# Patient Record
Sex: Male | Born: 1970 | Race: White | Hispanic: No | Marital: Married | Smoking: Never smoker
Health system: Southern US, Community
[De-identification: ages and names within clinical notes are randomized; demographics above are authoritative.]

## PROBLEM LIST (undated history)

## (undated) DIAGNOSIS — I10 Essential (primary) hypertension: Secondary | ICD-10-CM

## (undated) DIAGNOSIS — E7800 Pure hypercholesterolemia, unspecified: Secondary | ICD-10-CM

## (undated) DIAGNOSIS — I2510 Atherosclerotic heart disease of native coronary artery without angina pectoris: Secondary | ICD-10-CM

## (undated) HISTORY — PX: CHOLECYSTECTOMY: SHX55

---

## 2005-05-17 ENCOUNTER — Emergency Department: Payer: Self-pay | Admitting: Emergency Medicine

## 2010-05-06 ENCOUNTER — Ambulatory Visit: Payer: Self-pay | Admitting: Oncology

## 2010-05-20 ENCOUNTER — Ambulatory Visit: Payer: Self-pay | Admitting: Oncology

## 2010-06-06 ENCOUNTER — Ambulatory Visit: Payer: Self-pay | Admitting: Oncology

## 2018-08-10 ENCOUNTER — Ambulatory Visit
Admission: RE | Admit: 2018-08-10 | Discharge: 2018-08-10 | Disposition: A | Discharge status: Home / Self Care | Payer: BC Managed Care – PPO | Source: Ambulatory Visit | Attending: Physician Assistant | Admitting: Physician Assistant

## 2018-08-10 ENCOUNTER — Other Ambulatory Visit: Payer: Self-pay | Admitting: Physician Assistant

## 2018-08-10 DIAGNOSIS — R059 Cough, unspecified: Secondary | ICD-10-CM

## 2018-08-10 DIAGNOSIS — R05 Cough: Secondary | ICD-10-CM | POA: Diagnosis not present

## 2019-11-04 ENCOUNTER — Other Ambulatory Visit: Payer: Self-pay | Admitting: *Deleted

## 2019-11-04 DIAGNOSIS — R1011 Right upper quadrant pain: Secondary | ICD-10-CM

## 2019-11-10 ENCOUNTER — Ambulatory Visit
Admission: RE | Admit: 2019-11-10 | Discharge: 2019-11-10 | Disposition: A | Discharge status: Home / Self Care | Payer: BC Managed Care – PPO | Source: Ambulatory Visit | Attending: *Deleted | Admitting: *Deleted

## 2019-11-10 ENCOUNTER — Other Ambulatory Visit: Payer: Self-pay

## 2019-11-10 DIAGNOSIS — R1011 Right upper quadrant pain: Secondary | ICD-10-CM | POA: Diagnosis not present

## 2019-11-23 IMAGING — CR DG CHEST 2V
1 series · 2 of 2 positions shown · non-contrast
Comparison: None.

CLINICAL DATA: Cough for 2 months.

EXAM:
CHEST - 2 VIEW

[Series 1: dg chest 2 view · 0.14mm/px · 2 of 2 slices shown]
[im 1/2]
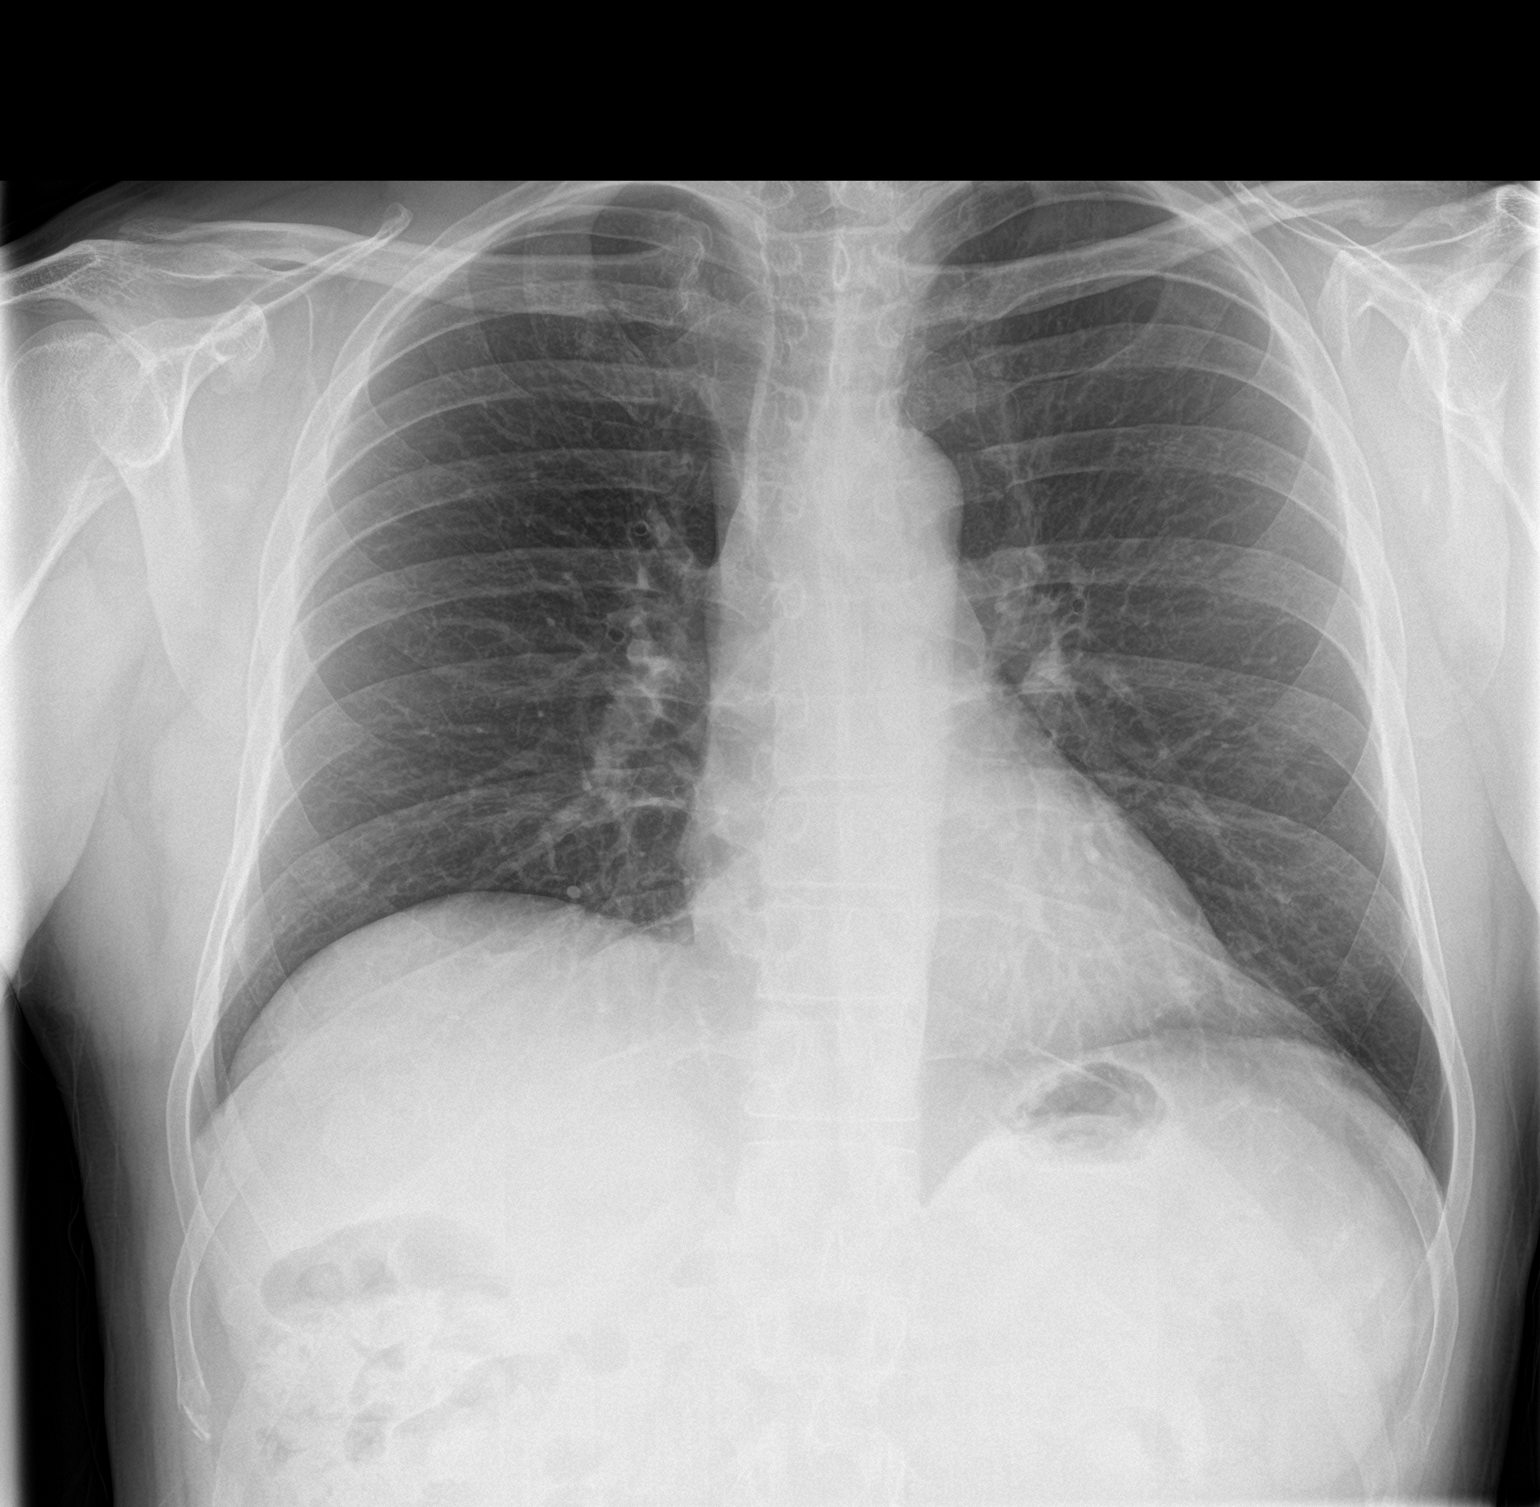
[im 2/2]
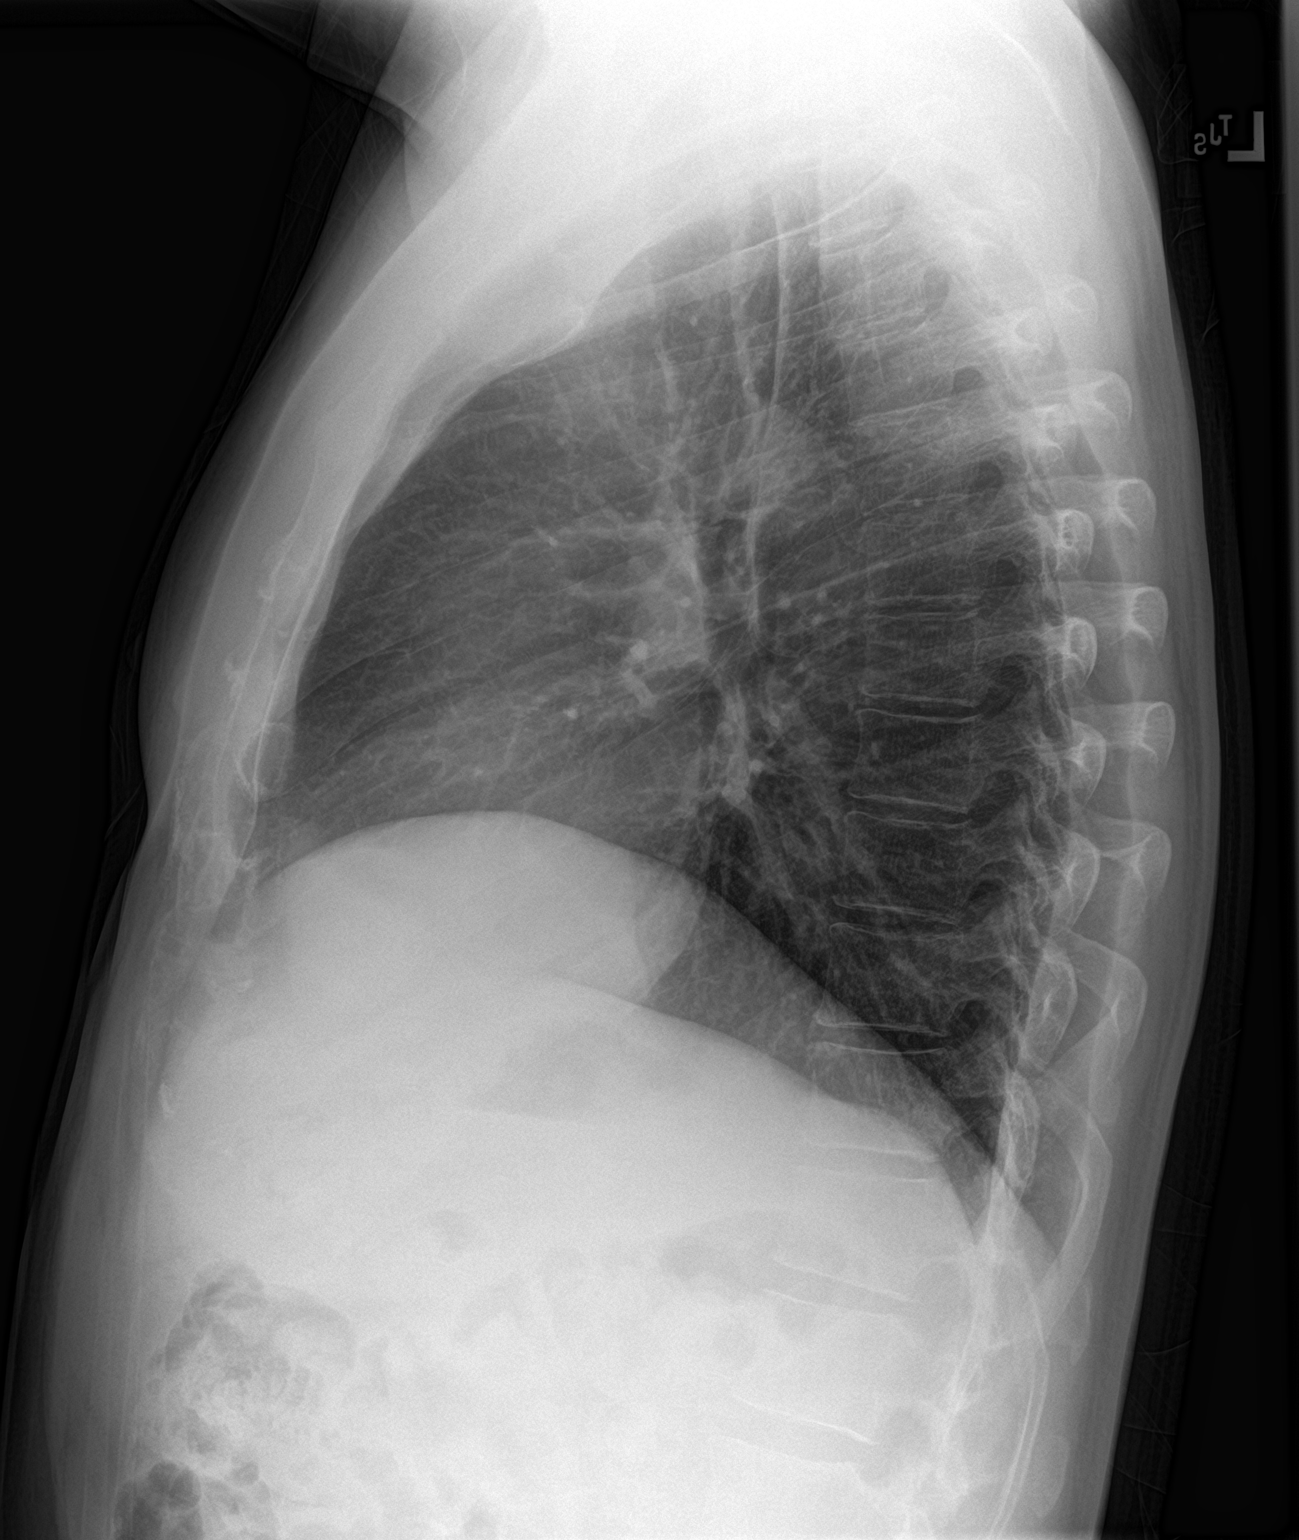

[2 of 2 positions shown; findings below may reference images not displayed]

FINDINGS: The cardiomediastinal silhouette is unremarkable.

There is no evidence of focal airspace disease, pulmonary edema,
suspicious pulmonary nodule/mass, pleural effusion, or pneumothorax.

No acute bony abnormalities are identified.
IMPRESSION: No active cardiopulmonary disease.

## 2021-06-09 ENCOUNTER — Other Ambulatory Visit: Payer: Self-pay

## 2021-06-09 ENCOUNTER — Ambulatory Visit: Admission: EM | Admit: 2021-06-09 | Discharge: 2021-06-09 | Payer: BC Managed Care – PPO

## 2021-06-09 DIAGNOSIS — I10 Essential (primary) hypertension: Secondary | ICD-10-CM

## 2021-06-09 NOTE — ED Notes (Signed)
Per provider based on patients elevated blood pressure needs to be seen in the ED. Pt was advised to go to the ED for further evaluation. Voiced understanding.

## 2021-06-09 NOTE — ED Triage Notes (Signed)
Patient presents to Urgent Care with complaints of cough and congestion. Pt also reports headache and discomfort located at the center of his chest with coughing. He states he has been treating symptoms with mucinex and reports he has not taken his blood pressure medications x 4 days.   Denies any changes in vision or SOB.

## 2021-06-09 NOTE — ED Provider Notes (Signed)
Patient presents to clinic today and blood pressure is severely elevated to 190/128 at initial check and at recheck to 195/130.  Patient reported not taking his blood pressure medications for the last 4 days.  He reports headache and discomfort located to the center of his chest with coughing.  He reported to clinic today for cough and congestion.  Given blood pressure reading in clinic, this does constitute a hypertensive emergency for which the patient will need to present to the emergency department for regulation of blood pressure and work-up.  No charge visit.   Amalia Greenhouse, [place] 06/09/21 [phone]

## 2021-06-09 NOTE — Discharge Instructions (Addendum)
Your current condition warrants further evaluation and/or treatment which exceed services available to you in this urgent care setting. I have discussed with you your currrent condition and the need for further evaluation and/or treatment in an emergency department setting. In response to my medical recommendation, you have opted to go to the emergency department. 

## 2022-01-09 ENCOUNTER — Other Ambulatory Visit: Payer: Self-pay

## 2022-01-09 NOTE — Progress Notes (Signed)
Pt complete pre-employment uds. HR,Notified. ?

## 2024-01-28 ENCOUNTER — Telehealth (HOSPITAL_COMMUNITY): Payer: Self-pay | Admitting: *Deleted

## 2024-01-28 ENCOUNTER — Other Ambulatory Visit: Payer: Self-pay | Admitting: Cardiology

## 2024-01-28 DIAGNOSIS — R0789 Other chest pain: Secondary | ICD-10-CM

## 2024-01-28 DIAGNOSIS — R079 Chest pain, unspecified: Secondary | ICD-10-CM

## 2024-01-28 NOTE — Telephone Encounter (Signed)
 Reaching out to patient to offer assistance regarding upcoming cardiac imaging study; pt verbalizes understanding of appt date/time, parking situation and where to check in, pre-test NPO status and medications ordered, and verified current allergies; name and call back number provided for further questions should they arise  Larey Brick RN Navigator Cardiac Imaging [facility] [phone] office [phone] cell  Patient to take 50mg  metoprolol tartrate two hours prior to his cardiac CT scan.

## 2024-02-01 ENCOUNTER — Ambulatory Visit
Admission: RE | Admit: 2024-02-01 | Discharge: 2024-02-01 | Disposition: A | Discharge status: Home / Self Care | Source: Ambulatory Visit | Attending: Cardiology

## 2024-02-01 ENCOUNTER — Other Ambulatory Visit: Payer: Self-pay | Admitting: Cardiology

## 2024-02-01 DIAGNOSIS — I200 Unstable angina: Secondary | ICD-10-CM | POA: Diagnosis not present

## 2024-02-01 DIAGNOSIS — R079 Chest pain, unspecified: Secondary | ICD-10-CM | POA: Insufficient documentation

## 2024-02-01 DIAGNOSIS — R0789 Other chest pain: Secondary | ICD-10-CM | POA: Insufficient documentation

## 2024-02-01 DIAGNOSIS — R931 Abnormal findings on diagnostic imaging of heart and coronary circulation: Secondary | ICD-10-CM | POA: Insufficient documentation

## 2024-02-01 DIAGNOSIS — I25110 Atherosclerotic heart disease of native coronary artery with unstable angina pectoris: Secondary | ICD-10-CM | POA: Diagnosis not present

## 2024-02-01 MED ORDER — METOPROLOL TARTRATE 5 MG/5ML IV SOLN
10.0000 mg | Freq: Once | INTRAVENOUS | Status: DC | PRN
  Filled 2024-02-01: qty 10

## 2024-02-01 MED ORDER — DILTIAZEM HCL 25 MG/5ML IV SOLN
10.0000 mg | INTRAVENOUS | Status: DC | PRN
  Filled 2024-02-01: qty 5

## 2024-02-01 MED ORDER — NITROGLYCERIN 0.4 MG SL SUBL
SUBLINGUAL_TABLET | SUBLINGUAL | Status: AC
  Filled 2024-02-01: qty 2

## 2024-02-01 MED ORDER — IOHEXOL 350 MG/ML SOLN
80.0000 mL | Freq: Once | INTRAVENOUS | Status: AC | PRN
  Administered 2024-02-01: 80 mL via INTRAVENOUS

## 2024-02-01 MED ORDER — NITROGLYCERIN 0.4 MG SL SUBL
0.8000 mg | SUBLINGUAL_TABLET | Freq: Once | SUBLINGUAL | Status: AC
  Administered 2024-02-01: 0.8 mg via SUBLINGUAL
  Filled 2024-02-01: qty 25

## 2024-02-02 ENCOUNTER — Inpatient Hospital Stay
Admission: EM | Admit: 2024-02-02 | Discharge: 2024-02-04 | DRG: 322 | Disposition: A | Discharge status: Home / Self Care | Source: Ambulatory Visit | Attending: Internal Medicine | Admitting: Internal Medicine

## 2024-02-02 ENCOUNTER — Other Ambulatory Visit: Payer: Self-pay

## 2024-02-02 ENCOUNTER — Emergency Department

## 2024-02-02 DIAGNOSIS — I25110 Atherosclerotic heart disease of native coronary artery with unstable angina pectoris: Secondary | ICD-10-CM | POA: Diagnosis present

## 2024-02-02 DIAGNOSIS — Z79899 Other long term (current) drug therapy: Secondary | ICD-10-CM | POA: Diagnosis not present

## 2024-02-02 DIAGNOSIS — Z7902 Long term (current) use of antithrombotics/antiplatelets: Secondary | ICD-10-CM

## 2024-02-02 DIAGNOSIS — I200 Unstable angina: Secondary | ICD-10-CM | POA: Diagnosis present

## 2024-02-02 DIAGNOSIS — Z7982 Long term (current) use of aspirin: Secondary | ICD-10-CM

## 2024-02-02 DIAGNOSIS — Z955 Presence of coronary angioplasty implant and graft: Secondary | ICD-10-CM | POA: Diagnosis not present

## 2024-02-02 DIAGNOSIS — Z950 Presence of cardiac pacemaker: Secondary | ICD-10-CM | POA: Diagnosis not present

## 2024-02-02 DIAGNOSIS — K219 Gastro-esophageal reflux disease without esophagitis: Secondary | ICD-10-CM | POA: Diagnosis present

## 2024-02-02 DIAGNOSIS — I2510 Atherosclerotic heart disease of native coronary artery without angina pectoris: Secondary | ICD-10-CM | POA: Insufficient documentation

## 2024-02-02 DIAGNOSIS — E7800 Pure hypercholesterolemia, unspecified: Secondary | ICD-10-CM | POA: Diagnosis present

## 2024-02-02 DIAGNOSIS — E785 Hyperlipidemia, unspecified: Secondary | ICD-10-CM | POA: Insufficient documentation

## 2024-02-02 DIAGNOSIS — I10 Essential (primary) hypertension: Secondary | ICD-10-CM | POA: Diagnosis present

## 2024-02-02 HISTORY — DX: Pure hypercholesterolemia, unspecified: E78.00

## 2024-02-02 HISTORY — DX: Essential (primary) hypertension: I10

## 2024-02-02 HISTORY — DX: Atherosclerotic heart disease of native coronary artery without angina pectoris: I25.10

## 2024-02-02 LAB — TROPONIN I (HIGH SENSITIVITY)
Troponin I (High Sensitivity): 6 ng/L (ref <18)
Troponin I (High Sensitivity): 7 ng/L (ref <18)

## 2024-02-02 LAB — CBC
HCT: 45.8 % (ref 39.0–52.0)
Hemoglobin: 15.7 g/dL (ref 13.0–17.0)
MCH: 30.3 pg (ref 26.0–34.0)
MCHC: 34.3 g/dL (ref 30.0–36.0)
MCV: 88.2 fL (ref 80.0–100.0)
Platelets: 280 10*3/uL (ref 150–400)
RBC: 5.19 MIL/uL (ref 4.22–5.81)
RDW: 11.6 % (ref 11.5–15.5)
WBC: 7.3 10*3/uL (ref 4.0–10.5)
nRBC: 0 % (ref 0.0–0.2)

## 2024-02-02 LAB — MAGNESIUM: Magnesium: 2.4 mg/dL (ref 1.7–2.4)

## 2024-02-02 LAB — BASIC METABOLIC PANEL WITH GFR
Anion gap: 13 (ref 5–15)
BUN: 26 mg/dL — ABNORMAL HIGH (ref 6–20)
CO2: 25 mmol/L (ref 22–32)
Calcium: 9.1 mg/dL (ref 8.9–10.3)
Chloride: 99 mmol/L (ref 98–111)
Creatinine, Ser: 0.92 mg/dL (ref 0.61–1.24)
GFR, Estimated: >60 mL/min (ref >60)
Glucose, Bld: 91 mg/dL (ref 70–99)
Potassium: 3.4 mmol/L — ABNORMAL LOW (ref 3.5–5.1)
Sodium: 137 mmol/L (ref 135–145)

## 2024-02-02 LAB — APTT: aPTT: 26 s (ref 24–36)

## 2024-02-02 LAB — CREATININE, SERUM
Creatinine, Ser: 0.95 mg/dL (ref 0.61–1.24)
GFR, Estimated: >60 mL/min (ref >60)

## 2024-02-02 MED ORDER — HEPARIN SODIUM (PORCINE) 5000 UNIT/ML IJ SOLN
5000.0000 [IU] | Freq: Three times a day (TID) | INTRAMUSCULAR | Status: AC
  Administered 2024-02-02: 5000 [IU] via SUBCUTANEOUS
  Filled 2024-02-02: qty 1

## 2024-02-02 MED ORDER — ATORVASTATIN CALCIUM 20 MG PO TABS
40.0000 mg | ORAL_TABLET | Freq: Every day | ORAL | Status: DC
  Administered 2024-02-02: 40 mg via ORAL
  Filled 2024-02-02: qty 2

## 2024-02-02 MED ORDER — LABETALOL HCL 5 MG/ML IV SOLN: 5.0000 mg | INTRAVENOUS | Status: DC | PRN

## 2024-02-02 MED ORDER — ACETAMINOPHEN 650 MG RE SUPP: 650.0000 mg | Freq: Four times a day (QID) | RECTAL | Status: DC | PRN

## 2024-02-02 MED ORDER — ACETAMINOPHEN 325 MG PO TABS
650.0000 mg | ORAL_TABLET | Freq: Four times a day (QID) | ORAL | Status: DC | PRN
  Filled 2024-02-02: qty 2

## 2024-02-02 MED ORDER — HEPARIN SODIUM (PORCINE) 5000 UNIT/ML IJ SOLN: 5000.0000 [IU] | Freq: Three times a day (TID) | INTRAMUSCULAR | Status: DC

## 2024-02-02 MED ORDER — NITROGLYCERIN 0.4 MG SL SUBL: 0.4000 mg | SUBLINGUAL_TABLET | SUBLINGUAL | Status: DC | PRN

## 2024-02-02 MED ORDER — ONDANSETRON HCL 4 MG/2ML IJ SOLN: 4.0000 mg | Freq: Four times a day (QID) | INTRAMUSCULAR | Status: DC | PRN

## 2024-02-02 MED ORDER — ONDANSETRON HCL 4 MG PO TABS: 4.0000 mg | ORAL_TABLET | Freq: Four times a day (QID) | ORAL | Status: DC | PRN

## 2024-02-02 MED ORDER — ASPIRIN 81 MG PO CHEW
324.0000 mg | CHEWABLE_TABLET | Freq: Once | ORAL | Status: AC
  Administered 2024-02-02: 324 mg via ORAL
  Filled 2024-02-02: qty 4

## 2024-02-02 NOTE — ED Triage Notes (Signed)
 Pt to ED for unstable angina--central chest pain--since 1 month. Had CT yesterday showing coronary artery blockages, 70% in one artery Sent by [facility] cardiologist to be admitted so can have heart  catheterization tomorrow. Pt does not take blood thinners. Took ibuprofen today--unsure dosage--no aspirin today. Skin is dry, respirations unlabored.

## 2024-02-02 NOTE — Assessment & Plan Note (Signed)
 Nitroglycerin as needed for chest pain Status post aspirin 324 mg p.o. one-time dose N.p.o. after midnight in NSAID patient left heart cath tomorrow morning

## 2024-02-02 NOTE — ED Provider Notes (Signed)
 [facility] Provider Note    Event Date/Time   First MD Initiated Contact with Patient 02/02/24 1842     (approximate)   History   Chief Complaint: Chest Pain   HPI  Barry Thornton is a 53 y.o. male with past medical history significant for hypertension, hyperlipidemia, GERD. Never smoker and no family history of CAD.   CTA coronaries/20 05/2024 showed CAC score of 643 with severe stenosis in proximal RCA and LAD.   Went to cardiology today for follow-up, noting that he is having increased chest discomfort, episodes of midsternal burning chest discomfort usually at rest which can last around 15 to 20 minutes. Having about 5-6 episodes a day. No shortness of breath, palpitation, dizziness or syncope.  Patient reports that the symptoms have been gradually developing and worsening over the past several months.  Wife notes that the patient has been frequently holding his chest over the past week.  Patient denies shortness of breath, diaphoresis dizziness or vomiting.  Reports chest pain seems to occur at random, not exertional.  Not pleuritic.  No alleviating factors.  Seems to be worse in the morning and associated with a dry cough.  Spouse notes that he previously had been on medication for hyperlipidemia but he stopped taking it many years ago.      Past Medical History:  Diagnosis Date   Coronary artery disease    Hypercholesterolemia    Hypertension       Past Surgical History:  Procedure Laterality Date   CHOLECYSTECTOMY      Physical Exam   Triage Vital Signs: ED Triage Vitals  Encounter Vitals Group     BP 02/02/24 1450 (!) 148/98     Systolic BP Percentile --      Diastolic BP Percentile --      Pulse Rate 02/02/24 1450 80     Resp 02/02/24 1450 20     Temp 02/02/24 1450 98.9 F (37.2 C)     Temp Source 02/02/24 1450 Oral     SpO2 02/02/24 1450 98 %     Weight 02/02/24 1450 170 lb (77.1 kg)     Height 02/02/24 1450 5\' 9"  (1.753  m)     Head Circumference --      Peak Flow --      Pain Score 02/02/24 1451 1     Pain Loc --      Pain Education --      Exclude from Growth Chart --     Most recent vital signs: Vitals:   02/02/24 1759 02/02/24 1935  BP: (!) 141/99   Pulse: 90   Resp: 20   Temp:  98.2 F (36.8 C)  SpO2: 100%     General: Awake, no distress.  CV:  Good peripheral perfusion.  Regular rate rhythm.  Normal distal pulses Resp:  Normal effort.  Clear to auscultation bilaterally Abd:  No distention.  Soft nontender Other:  No lower extremity edema   ED Results / Procedures / Treatments   Labs (all labs ordered are listed, but only abnormal results are displayed) Labs Reviewed  BASIC METABOLIC PANEL WITH GFR - Abnormal; Notable for the following components:      Result Value   Potassium 3.4 (*)    BUN 26 (*)    All other components within normal limits  CBC  APTT  CREATININE, SERUM  BASIC METABOLIC PANEL WITH GFR  CBC  MAGNESIUM  TROPONIN I (HIGH SENSITIVITY)  TROPONIN I (  HIGH SENSITIVITY)     EKG Interpreted by me Normal sinus rhythm rate of 85.  Right axis, normal intervals.  Poor R wave progression.  Normal ST segments.  Slight inferolateral T wave inversions.   RADIOLOGY Chest x-ray interpreted by me, unremarkable.  Radiology report reviewed   PROCEDURES:  Procedures   MEDICATIONS ORDERED IN ED: Medications  nitroGLYCERIN (NITROSTAT) SL tablet 0.4 mg (has no administration in time range)  acetaminophen (TYLENOL) tablet 650 mg (has no administration in time range)    Or  acetaminophen (TYLENOL) suppository 650 mg (has no administration in time range)  ondansetron (ZOFRAN) tablet 4 mg (has no administration in time range)    Or  ondansetron (ZOFRAN) injection 4 mg (has no administration in time range)  heparin injection 5,000 Units (has no administration in time range)  aspirin chewable tablet 324 mg (324 mg Oral Given 02/02/24 1929)     IMPRESSION / MDM /  ASSESSMENT AND PLAN / ED COURSE  I reviewed the triage vital signs and the nursing notes.  DDx: Unstable angina, NSTEMI, pneumothorax, pneumonia, GERD  Patient's presentation is most consistent with acute presentation with potential threat to life or bodily function.  Patient presents with worsening chest pain episodes over the past several months.  Patient denies exertional component, but he recently had a CT CA which was markedly abnormal.  On follow-up with cardiology he was sent to the ED for admission for cardiac catheterization tomorrow.  Per cardiology recommendations, will give aspirin.  Troponin is normal, so will defer heparin for now.  Will give nitroglycerin with goal of chest pain resolution.   ----------------------------------------- 7:38 PM on 02/02/2024 ----------------------------------------- Case discussed with hospitalist      FINAL CLINICAL IMPRESSION(S) / ED DIAGNOSES   Final diagnoses:  Unstable angina (HCC)     Rx / DC Orders   ED Discharge Orders     None        Note:  This document was prepared using Dragon voice recognition software and may include unintentional dictation errors.   Jacquie Maudlin, MD 02/02/24 [phone]

## 2024-02-02 NOTE — ED Notes (Signed)
 Assumed care of pt at this time. Pt moved to holding area to wait for inpatient bed. Pt ambulatory to restroom, gait steady. Pt denies needs at this time. Wife at bedside. Call light within reach. VSS.

## 2024-02-02 NOTE — Assessment & Plan Note (Signed)
 Pending med reconciliation Home hydrochlorothiazide 25 mg daily, amlodipine-losartan 5-160 milligrams daily not resumed on admission Patient's antihypertensive medication will be modified by cardiology team Labetalol 5 mg IV every 3 hours as needed for SBP greater 170, 4 doses ordered

## 2024-02-02 NOTE — Assessment & Plan Note (Signed)
 Patient takes previously rosuvastatin 5 mg nightly

## 2024-02-02 NOTE — H&P (Signed)
 History and Physical   Rorke Pagliarulo ZOX:[id number] DOB: [birth year] DOA: 02/02/2024  PCP: Powell-Tillman, Levonne Genese, MD Outpatient Specialists: Dr. [facility] cardiology Patient coming from: [facility]  I have personally briefly reviewed patient's old medical records in [facility] EMR.  Chief Concern: unstable angina  HPI: Mr. Maxfield Olbera is a 53 year old male with history of hypertension, hyperlipidemia, GERD, CAD, who presents emergency department for chief concerns of chest pain from [facility] clinic .  Patient was sent from coronary clinic for chief concerns of unstable angina.  Vitals in the ED showed temperature of 98.9, respiration rate 20, heart rate 80, blood pressure 148/98, SpO2 99% on room air.  Serum sodium is 137, potassium 3.4, chloride 99, bicarb 24, BUN of 26, serum creatinine 0.92, EGFR greater than 60, nonfasting glucose 91, WBC 7.3, hemoglobin 15.7, platelet 280.  High sensitive 1+ and RBC 7.  ED treatment: Aspirin 324 mg p.o. one-time dose, as needed nitroglycerin.  Patient is scheduled for left heart cath in a.m. --------------------------------- At bedside, patient able to tell me his first and last name, age, location, current calendar year.  Patient has been having on and off chest pain for about 2 months.  He denies trauma to his person.  He reports the pain is like a heartburn x 5 and he describes it as sharp.  He reports over the last 2 months he is also experienced intermittent lightheadedness.  He denies arm discomfort, shortness of breath, neck, jaw discomfort, abnormal taste in his mouth  He reports he is never felt this way before.  Social history: He lives at home with his wife.  He denies tobacco, EtOH, recreational drug use.  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  He works part-time at [employer] and [employer].  ROS: Constitutional: no weight change, no fever ENT/Mouth: no sore throat, no rhinorrhea Eyes: no eye  pain, no vision changes Cardiovascular: + chest pain, no dyspnea,  no edema, no palpitations Respiratory: no cough, no sputum, no wheezing Gastrointestinal: no nausea, no vomiting, no diarrhea, no constipation Genitourinary: no urinary incontinence, no dysuria, no hematuria Musculoskeletal: no arthralgias, no myalgias Skin: no skin lesions, no pruritus, Neuro: no weakness, no loss of consciousness, no syncope Psych: no anxiety, no depression, no decrease appetite Heme/Lymph: no bruising, no bleeding  ED Course: With EDP, patient requiring hospitalization for chief concerns of unstable angina.  Assessment/Plan  Principal Problem:   Unstable angina (HCC) Active Problems:   Essential hypertension   GERD (gastroesophageal reflux disease)   CAD (coronary artery disease)   Hyperlipidemia   Assessment and Plan:  * Unstable angina (HCC) Nitroglycerin as needed for chest pain Status post aspirin 324 mg p.o. one-time dose N.p.o. after midnight in NSAID patient left heart cath tomorrow morning  Hyperlipidemia Patient takes previously rosuvastatin 5 mg nightly  CAD (coronary artery disease) Status post aspirin 324 mg p.o. one-time dose  Essential hypertension Pending med reconciliation Home hydrochlorothiazide 25 mg daily, amlodipine-losartan 5-160 milligrams daily not resumed on admission Patient's antihypertensive medication will be modified by cardiology team Labetalol 5 mg IV every 3 hours as needed for SBP greater 170, 4 doses ordered  Chart reviewed.   DVT prophylaxis: Heparin 5000 units subcutaneous one-time dose.  AM team to initiate pharmacologic DVT prophylaxis when the benefits outweigh the risk Code Status: Full code Diet: Heart healthy; n.p.o. after midnight Family Communication: Updated spouse and son at bedside with patient's permission Disposition Plan: Pending clinical course Consults called: cardiology Admission  status: Telemetry cardiac, inpatient  Past  Medical History:  Diagnosis Date   Coronary artery disease    Hypercholesterolemia    Hypertension    Past Surgical History:  Procedure Laterality Date   CHOLECYSTECTOMY     Social History:  reports that he has never smoked. He has never used smokeless tobacco. He reports that he does not currently use alcohol. He reports that he does not currently use drugs.  No Known Allergies Family History  Problem Relation Age of Onset   Hyperlipidemia Mother    Hyperlipidemia Maternal Grandmother    Family history: Family history reviewed and not pertinent.  Prior to Admission medications   Rosuvastatin 5 mg daily; amlodipine-valsartan 5-160 mg; hydrochlorothiazide 25 mg   Physical Exam: Vitals:   02/02/24 1450 02/02/24 1759 02/02/24 1935  BP: (!) 148/98 (!) 141/99   Pulse: 80 90   Resp: 20 20   Temp: 98.9 F (37.2 C)  98.2 F (36.8 C)  TempSrc: Oral  Oral  SpO2: 98% 100%   Weight: 77.1 kg    Height: 5\' 9"  (1.753 m)     Constitutional: appears age-appropriate, NAD, calm Eyes: PERRL, lids and conjunctivae normal ENMT: Mucous membranes are moist. Posterior pharynx clear of any exudate or lesions. Age-appropriate dentition. Hearing appropriate Neck: normal, supple, no masses, no thyromegaly Respiratory: clear to auscultation bilaterally, no wheezing, no crackles. Normal respiratory effort. No accessory muscle use.  Cardiovascular: Regular rate and rhythm, no murmurs / rubs / gallops. No extremity edema. 2+ pedal pulses. No carotid bruits.  Abdomen: no tenderness, no masses palpated, no hepatosplenomegaly. Bowel sounds positive.  Musculoskeletal: no clubbing / cyanosis. No joint deformity upper and lower extremities. Good ROM, no contractures, no atrophy. Normal muscle tone.  Skin: no rashes, lesions, ulcers. No induration Neurologic: Sensation intact. Strength 5/5 in all 4.  Psychiatric: Normal judgment and insight. Alert and oriented x 3. Normal mood.   EKG: independently  reviewed, showing sinus rhythm with rate of 85, QTc 459  Chest x-ray on Admission: I personally reviewed and I agree with radiologist reading as below.  DG Chest 2 View Result Date: 02/02/2024 CLINICAL DATA:  Chest pain. EXAM: CHEST - 2 VIEW COMPARISON:  08/10/2018. FINDINGS: Bilateral lung fields are clear. Bilateral costophrenic angles are clear. Note is made of elevated right hemidiaphragm. Normal cardio-mediastinal silhouette. No acute osseous abnormalities. The soft tissues are within normal limits. There are surgical clips in the right upper quadrant, typical of a previous cholecystectomy. IMPRESSION: No active cardiopulmonary disease. Electronically Signed   By: Beula Brunswick M.D.   On: 02/02/2024 15:22   CT CORONARY FFR DATA PREP & FLUID ANALYSIS Result Date: 02/02/2024 EXAM: CT FFR ANALYSIS CLINICAL DATA:  Abnormal CCTA FINDINGS: FFRct analysis was performed on the original cardiac CT angiogram dataset. Diagrammatic representation of the FFRct analysis is provided in a separate PDF document in PACS. This dictation was created using the PDF document and an interactive 3D model of the results. 3D model is not available in the EMR/PACS. Normal FFR range is >0.80. 1. Left Main:  No significant stenosis. 2. LAD: significant stenosis in the proximal-mid segment. FFRct 0.78. 3. LCX: No significant stenosis. 4. RCA: Significant proximal stenosis.  FFRct 0.58 IMPRESSION: 1. CT FFR analysis showed significant stenosis in the proximal RCA, proximal-mid LAD. 2.  Recommend cardiac catheterization. Electronically Signed   By: Constancia Delton M.D.   On: 02/02/2024 09:46   CT CORONARY MORPH W/CTA COR W/SCORE W/CA W/CM &/OR WO/CM Addendum Date: 02/01/2024  ADDENDUM REPORT: 02/01/2024 14:23 EXAM: OVER-READ INTERPRETATION  CT CHEST The following report is an over-read performed by radiologist Dr. Asenath Blacker [facility] on 02/01/2024. This over-read does not include interpretation of cardiac or  coronary anatomy or pathology. The coronary CTA interpretation by the cardiologist is attached. COMPARISON:  None. FINDINGS: Heart is normal size. Aorta normal caliber. No adenopathy. Minimal dependent atelectasis in the right lower lobe. No confluent opacities or effusions. No acute findings in the upper abdomen. Chest wall soft tissues are unremarkable. No acute bony abnormality. IMPRESSION: No acute or significant extracardiac abnormality. Electronically Signed   By: Janeece Mechanic M.D.   On: 02/01/2024 14:23   Result Date: 02/01/2024 CLINICAL DATA:  Chest pain EXAM: Cardiac/Coronary  CTA TECHNIQUE: The patient was scanned on a Siemens Somatom scanner. : A retrospective scan was triggered in the ascending thoracic aorta. Axial non-contrast 3 mm slices were carried out through the heart. The data set was analyzed on a dedicated work station and scored using the Agatson method. Gantry rotation speed was 66 msecs and collimation was .6 mm. 50mg  of metoprolol and 0.8 mg of sl NTG was given. The 3D data set was reconstructed in 5% intervals of the 60-95 % of the R-R cycle. Diastolic phases were analyzed on a dedicated work station using MPR, MIP and VRT modes. The patient received 80 cc of contrast. FINDINGS: Aorta:  Normal size.  Aortic wall calcifications.  No dissection. Aortic Valve:  Trileaflet.  No calcifications. Coronary Arteries:  Normal coronary origin.  Right dominance. RCA is a dominant artery. There is predominantly non calcified plaque in proximal RCA causing severe stenosis (>70%). Left main gives rise to LAD and LCX arteries. LM has no disease. LAD has calcified plaque proximally causing severe stenosis (>70%). LCX is a non-dominant artery.  There is no plaque. Other findings: Normal pulmonary vein drainage into the left atrium. Normal left atrial appendage without a thrombus. Normal size of the pulmonary artery. IMPRESSION: 1. Coronary calcium score of 643. This was 98th percentile for age and sex  matched control. 2. Normal coronary origin with right dominance. 3. Severe stenosis in proximal RCA and LAD (>70%). 4. CAD-RADS 4 Severe stenosis. (70-99% or > 50% left main). Cardiac catheterization is recommended. Consider symptom-guided anti-ischemic pharmacotherapy as well as risk factor modification per guideline directed care. 5. Additional analysis with CT FFR will be submitted and reported separately. Electronically Signed: By: Constancia Delton M.D. On: 02/01/2024 13:48   Labs on Admission: I have personally reviewed following labs  CBC: Recent Labs  Lab 02/02/24 1454  WBC 7.3  HGB 15.7  HCT 45.8  MCV 88.2  PLT 280   Basic Metabolic Panel: Recent Labs  Lab 02/02/24 1454  NA 137  K 3.4*  CL 99  CO2 25  GLUCOSE 91  BUN 26*  CREATININE 0.92  CALCIUM 9.1   GFR: Estimated Creatinine Clearance: 92.9 mL/min (by C-G formula based on SCr of 0.92 mg/dL).  This document was prepared using Dragon Voice Recognition software and may include unintentional dictation errors.  Dr. Reinhold Carbine [facility]  If 7PM-7AM, please contact overnight-coverage provider If 7AM-7PM, please contact day attending provider [email or web address]  02/02/2024, 8:15 PM

## 2024-02-02 NOTE — ED Triage Notes (Signed)
 First Nurse Note: Patient to ED from [facility] cardiology for unstable angina. Recent scans show blockages per cardiologist. Sent for admit with cath tomorrow.

## 2024-02-02 NOTE — Hospital Course (Signed)
 Mr. Fayne Ahn is a 53 year old male with history of hypertension, hyperlipidemia, GERD, CAD, who presents emergency department for chief concerns of chest pain from [facility] clinic .  Patient was sent from coronary clinic for chief concerns of unstable angina.  Vitals in the ED showed temperature of 98.9, respiration rate 20, heart rate 80, blood pressure 148/98, SpO2 99% on room air.  Serum sodium is 137, potassium 3.4, chloride 99, bicarb 24, BUN of 26, serum creatinine 0.92, EGFR greater than 60, nonfasting glucose 91, WBC 7.3, hemoglobin 15.7, platelet 280.  High sensitive 1+ and RBC 7.  ED treatment: Aspirin 324 mg p.o. one-time dose, as needed nitroglycerin.  Patient is scheduled for left heart cath in a.m.

## 2024-02-02 NOTE — Assessment & Plan Note (Signed)
 Status post aspirin 324 mg p.o. one-time dose

## 2024-02-03 ENCOUNTER — Other Ambulatory Visit: Payer: Self-pay

## 2024-02-03 ENCOUNTER — Encounter: Payer: Self-pay | Admitting: Internal Medicine

## 2024-02-03 ENCOUNTER — Encounter
Admission: EM | Disposition: A | Discharge status: Home / Self Care | Payer: Self-pay | Source: Home / Self Care | Attending: Internal Medicine

## 2024-02-03 DIAGNOSIS — I10 Essential (primary) hypertension: Secondary | ICD-10-CM | POA: Diagnosis not present

## 2024-02-03 DIAGNOSIS — I200 Unstable angina: Secondary | ICD-10-CM

## 2024-02-03 DIAGNOSIS — Z950 Presence of cardiac pacemaker: Secondary | ICD-10-CM

## 2024-02-03 DIAGNOSIS — I25110 Atherosclerotic heart disease of native coronary artery with unstable angina pectoris: Secondary | ICD-10-CM | POA: Diagnosis not present

## 2024-02-03 HISTORY — PX: CORONARY STENT INTERVENTION: CATH118234

## 2024-02-03 HISTORY — PX: CORONARY ULTRASOUND/IVUS: CATH118244

## 2024-02-03 HISTORY — PX: LEFT HEART CATH AND CORONARY ANGIOGRAPHY: CATH118249

## 2024-02-03 LAB — CBC
HCT: 41.1 % (ref 39.0–52.0)
Hemoglobin: 14.7 g/dL (ref 13.0–17.0)
MCH: 30.9 pg (ref 26.0–34.0)
MCHC: 35.8 g/dL (ref 30.0–36.0)
MCV: 86.3 fL (ref 80.0–100.0)
Platelets: 248 10*3/uL (ref 150–400)
RBC: 4.76 MIL/uL (ref 4.22–5.81)
RDW: 11.5 % (ref 11.5–15.5)
WBC: 6.7 10*3/uL (ref 4.0–10.5)
nRBC: 0 % (ref 0.0–0.2)

## 2024-02-03 LAB — POCT ACTIVATED CLOTTING TIME
Activated Clotting Time: 228 s
Activated Clotting Time: 273 s

## 2024-02-03 LAB — BASIC METABOLIC PANEL WITH GFR
Anion gap: 10 (ref 5–15)
BUN: 29 mg/dL — ABNORMAL HIGH (ref 6–20)
CO2: 22 mmol/L (ref 22–32)
Calcium: 8.6 mg/dL — ABNORMAL LOW (ref 8.9–10.3)
Chloride: 105 mmol/L (ref 98–111)
Creatinine, Ser: 0.82 mg/dL (ref 0.61–1.24)
GFR, Estimated: >60 mL/min (ref >60)
Glucose, Bld: 96 mg/dL (ref 70–99)
Potassium: 3.6 mmol/L (ref 3.5–5.1)
Sodium: 137 mmol/L (ref 135–145)

## 2024-02-03 SURGERY — LEFT HEART CATH AND CORONARY ANGIOGRAPHY
Anesthesia: Moderate Sedation

## 2024-02-03 MED ORDER — HEPARIN (PORCINE) IN NACL 1000-0.9 UT/500ML-% IV SOLN
INTRAVENOUS | Status: DC | PRN
  Administered 2024-02-03: 1000 mL

## 2024-02-03 MED ORDER — ASPIRIN 81 MG PO CHEW
81.0000 mg | CHEWABLE_TABLET | Freq: Every day | ORAL | Status: DC
Start: 2024-02-04 — End: 2024-02-04
  Administered 2024-02-04: 81 mg via ORAL
  Filled 2024-02-03: qty 1

## 2024-02-03 MED ORDER — LIDOCAINE HCL 1 % IJ SOLN
INTRAMUSCULAR | Status: AC
  Filled 2024-02-03: qty 20

## 2024-02-03 MED ORDER — ASPIRIN 81 MG PO CHEW: 81.0000 mg | CHEWABLE_TABLET | ORAL | Status: DC

## 2024-02-03 MED ORDER — SODIUM CHLORIDE 0.9 % WEIGHT BASED INFUSION: 1.0000 mL/kg/h | INTRAVENOUS | Status: AC

## 2024-02-03 MED ORDER — ASPIRIN 81 MG PO CHEW
CHEWABLE_TABLET | ORAL | Status: AC
  Filled 2024-02-03: qty 3

## 2024-02-03 MED ORDER — VERAPAMIL HCL 2.5 MG/ML IV SOLN
INTRAVENOUS | Status: DC | PRN
  Administered 2024-02-03: 2.5 mg via INTRA_ARTERIAL

## 2024-02-03 MED ORDER — FENTANYL CITRATE (PF) 100 MCG/2ML IJ SOLN
INTRAMUSCULAR | Status: DC | PRN
  Administered 2024-02-03 (×2): 50 ug via INTRAVENOUS

## 2024-02-03 MED ORDER — SODIUM CHLORIDE 0.9 % IV SOLN: 250.0000 mL | INTRAVENOUS | Status: DC | PRN

## 2024-02-03 MED ORDER — METOPROLOL TARTRATE 50 MG PO TABS
50.0000 mg | ORAL_TABLET | Freq: Two times a day (BID) | ORAL | Status: DC
  Administered 2024-02-03 – 2024-02-04 (×2): 50 mg via ORAL
  Filled 2024-02-03 (×2): qty 1

## 2024-02-03 MED ORDER — SODIUM CHLORIDE 0.9 % WEIGHT BASED INFUSION: 1.0000 mL/kg/h | INTRAVENOUS | Status: DC

## 2024-02-03 MED ORDER — HEPARIN SODIUM (PORCINE) 1000 UNIT/ML IJ SOLN
INTRAMUSCULAR | Status: AC
  Filled 2024-02-03: qty 10

## 2024-02-03 MED ORDER — ASPIRIN 81 MG PO TBEC
81.0000 mg | DELAYED_RELEASE_TABLET | Freq: Every day | ORAL | Status: DC
  Administered 2024-02-04: 81 mg via ORAL
  Filled 2024-02-03: qty 1

## 2024-02-03 MED ORDER — MIDAZOLAM HCL 2 MG/2ML IJ SOLN
INTRAMUSCULAR | Status: DC | PRN
  Administered 2024-02-03 (×2): 1 mg via INTRAVENOUS

## 2024-02-03 MED ORDER — FENTANYL CITRATE (PF) 100 MCG/2ML IJ SOLN
INTRAMUSCULAR | Status: AC
  Filled 2024-02-03: qty 2

## 2024-02-03 MED ORDER — HEPARIN (PORCINE) IN NACL 1000-0.9 UT/500ML-% IV SOLN
INTRAVENOUS | Status: AC
  Filled 2024-02-03: qty 1000

## 2024-02-03 MED ORDER — IOHEXOL 300 MG/ML  SOLN
INTRAMUSCULAR | Status: DC | PRN
  Administered 2024-02-03: 110 mL

## 2024-02-03 MED ORDER — ASPIRIN 81 MG PO CHEW
CHEWABLE_TABLET | ORAL | Status: DC | PRN
  Administered 2024-02-03: 243 mg via ORAL

## 2024-02-03 MED ORDER — METOPROLOL SUCCINATE ER 50 MG PO TB24: 25.0000 mg | ORAL_TABLET | Freq: Every day | ORAL | Status: DC

## 2024-02-03 MED ORDER — PRASUGREL HCL 10 MG PO TABS
ORAL_TABLET | ORAL | Status: DC | PRN
  Administered 2024-02-03: 60 mg via ORAL

## 2024-02-03 MED ORDER — HEPARIN SODIUM (PORCINE) 1000 UNIT/ML IJ SOLN
INTRAMUSCULAR | Status: DC | PRN
Start: 2024-02-03 — End: 2024-02-03
  Administered 2024-02-03: 5000 [IU] via INTRAVENOUS
  Administered 2024-02-03: 3000 [IU] via INTRAVENOUS
  Administered 2024-02-03: 4000 [IU] via INTRAVENOUS
  Administered 2024-02-03: 3000 [IU] via INTRAVENOUS

## 2024-02-03 MED ORDER — IRBESARTAN 150 MG PO TABS
150.0000 mg | ORAL_TABLET | Freq: Every day | ORAL | Status: DC
  Administered 2024-02-04: 150 mg via ORAL
  Filled 2024-02-03 (×2): qty 1

## 2024-02-03 MED ORDER — LIDOCAINE HCL (PF) 1 % IJ SOLN
INTRAMUSCULAR | Status: DC | PRN
  Administered 2024-02-03: 2 mL

## 2024-02-03 MED ORDER — ACETAMINOPHEN 325 MG PO TABS
650.0000 mg | ORAL_TABLET | ORAL | Status: DC | PRN
  Administered 2024-02-03: 650 mg via ORAL

## 2024-02-03 MED ORDER — SODIUM CHLORIDE 0.9% FLUSH: 3.0000 mL | Freq: Two times a day (BID) | INTRAVENOUS | Status: DC

## 2024-02-03 MED ORDER — AMLODIPINE BESYLATE 5 MG PO TABS
5.0000 mg | ORAL_TABLET | Freq: Every day | ORAL | Status: DC
  Administered 2024-02-04: 5 mg via ORAL
  Filled 2024-02-03: qty 1

## 2024-02-03 MED ORDER — SODIUM CHLORIDE 0.9 % WEIGHT BASED INFUSION
3.0000 mL/kg/h | INTRAVENOUS | Status: DC
  Administered 2024-02-03: 3 mL/kg/h via INTRAVENOUS

## 2024-02-03 MED ORDER — MIDAZOLAM HCL 2 MG/2ML IJ SOLN
INTRAMUSCULAR | Status: AC
Start: 2024-02-03 — End: ?
  Filled 2024-02-03: qty 2

## 2024-02-03 MED ORDER — SODIUM CHLORIDE 0.9% FLUSH: 3.0000 mL | INTRAVENOUS | Status: DC | PRN

## 2024-02-03 MED ORDER — PRASUGREL HCL 10 MG PO TABS
ORAL_TABLET | ORAL | Status: AC
  Filled 2024-02-03: qty 6

## 2024-02-03 MED ORDER — ROSUVASTATIN CALCIUM 10 MG PO TABS
40.0000 mg | ORAL_TABLET | Freq: Every day | ORAL | Status: DC
  Administered 2024-02-04: 40 mg via ORAL
  Filled 2024-02-03: qty 4

## 2024-02-03 MED ORDER — OXYCODONE HCL 5 MG PO TABS
5.0000 mg | ORAL_TABLET | Freq: Four times a day (QID) | ORAL | Status: DC | PRN
  Administered 2024-02-03 – 2024-02-04 (×2): 5 mg via ORAL
  Filled 2024-02-03 (×2): qty 1

## 2024-02-03 MED ORDER — HYDRALAZINE HCL 20 MG/ML IJ SOLN: 10.0000 mg | INTRAMUSCULAR | Status: AC | PRN

## 2024-02-03 MED ORDER — PRASUGREL HCL 10 MG PO TABS
10.0000 mg | ORAL_TABLET | Freq: Every day | ORAL | Status: DC
  Administered 2024-02-04: 10 mg via ORAL
  Filled 2024-02-03: qty 1

## 2024-02-03 MED ORDER — VERAPAMIL HCL 2.5 MG/ML IV SOLN
INTRAVENOUS | Status: AC
Start: 2024-02-03 — End: ?
  Filled 2024-02-03: qty 2

## 2024-02-03 MED ORDER — NITROGLYCERIN 1 MG/10 ML FOR IR/CATH LAB
INTRA_ARTERIAL | Status: DC | PRN
  Administered 2024-02-03 (×3): 200 ug via INTRA_ARTERIAL

## 2024-02-03 MED ORDER — SODIUM CHLORIDE 0.9% FLUSH
3.0000 mL | Freq: Two times a day (BID) | INTRAVENOUS | Status: DC
  Administered 2024-02-04 (×2): 3 mL via INTRAVENOUS

## 2024-02-03 SURGICAL SUPPLY — 23 items
BALLOON EUPHORA RX 3.0X20 (BALLOONS) IMPLANT
BALLOON ~~LOC~~ EUPHORA RX 4.0X20 (BALLOONS) IMPLANT
BALLOON ~~LOC~~ TREK NEO RX 2.75X15 (BALLOONS) IMPLANT
BALLOON ~~LOC~~ TREK NEO RX 3.25X15 (BALLOONS) IMPLANT
CATH EAGLE EYE PLAT IMAGING (CATHETERS) IMPLANT
CATH INFINITI 5 FR JL3.5 (CATHETERS) IMPLANT
CATH INFINITI JR4 5F (CATHETERS) IMPLANT
CATH VISTA GUIDE 6FR JL3.5 MPK (CATHETERS) IMPLANT
CATH VISTA GUIDE 6FR JR4 ECOPK (CATHETERS) IMPLANT
DEVICE RAD TR BAND REGULAR (VASCULAR PRODUCTS) IMPLANT
DRAPE BRACHIAL (DRAPES) IMPLANT
GLIDESHEATH SLEND A-KIT 6F 22G (SHEATH) IMPLANT
GUIDEWIRE INQWIRE 1.5J.035X260 (WIRE) IMPLANT
KIT ENCORE 26 ADVANTAGE (KITS) IMPLANT
KIT SYRINGE INJ CVI SPIKEX1 (MISCELLANEOUS) IMPLANT
PACK CARDIAC CATH (CUSTOM PROCEDURE TRAY) ×1 IMPLANT
SET ATX-X65L (MISCELLANEOUS) IMPLANT
STATION PROTECTION PRESSURIZED (MISCELLANEOUS) IMPLANT
STENT ONYX FRONTIER 3.0X22 (Permanent Stent) IMPLANT
STENT ONYX FRONTIER 4.0X30 (Permanent Stent) IMPLANT
VALVE COPILOT STAT (MISCELLANEOUS) IMPLANT
WIRE ASAHI PROWATER 180CM (WIRE) IMPLANT
WIRE G HI TQ BMW 190 (WIRE) IMPLANT

## 2024-02-03 NOTE — Progress Notes (Signed)
 Progress Note   Patient: Barry Thornton XBM:[id number] DOB: 18-Dec-1970 DOA: 02/02/2024     1 DOS: the patient was seen and examined on 02/03/2024   Brief hospital course:  53 year old male with history of hypertension, hyperlipidemia, GERD, CAD, who presents emergency department for chief concerns of chest pain from [facility] .   Assessment and Plan:  Unstable angina - Followed in [place] clinic, CTA noting significant stenosis in patient reported progressively worsening atypical symptoms.  Cardiology following closely.  Left heart cath later on this afternoon.  Troponins negative so far.  Other lab work unremarkable.  Awaiting cardiology recommendations post cath.  CAD/hypertension/hyperlipidemia -Aspirin, statin on board.  Continue amlodipine, irbesartan, metoprolol.   Essential hypertension Pending med reconciliation Home hydrochlorothiazide 25 mg daily, amlodipine-losartan 5-160 milligrams daily not resumed on admission Patient's antihypertensive medication will be modified by cardiology team Labetalol 5 mg IV every 3 hours as needed for SBP greater 170, 4 doses ordered     Subjective: Patient resting comfortably this morning.  Denies any worsening shortness of breath, chest pain, diaphoresis, nausea, vomiting, abdominal pain.  Otherwise just feels tired.  Awaiting left heart cath later on this afternoon.  Physical Exam: Vitals:   02/03/24 1200 02/03/24 1208 02/03/24 1257 02/03/24 1335  BP: 134/87  (!) 146/90   Pulse: 80  68   Resp:   19   Temp:  98.4 F (36.9 C) 97.6 F (36.4 C)   TempSrc:  Oral Oral   SpO2: 98%  97% 99%  Weight:      Height:        GENERAL:  Alert, pleasant, no acute distress  HEENT:  EOMI CARDIOVASCULAR:  RRR, no murmurs appreciated RESPIRATORY:  Clear to auscultation, no wheezing, rales, or rhonchi GASTROINTESTINAL:  Soft, nontender, nondistended EXTREMITIES:  No LE edema bilaterally NEURO:  No new focal deficits appreciated SKIN:  No  rashes noted PSYCH:  Appropriate mood and affect    Data Reviewed:  There are no new results to review at this time.  Labs: CBC: Recent Labs  Lab 02/02/24 1454 02/03/24 0528  WBC 7.3 6.7  HGB 15.7 14.7  HCT 45.8 41.1  MCV 88.2 86.3  PLT 280 248   Basic Metabolic Panel: Recent Labs  Lab 02/02/24 1454 02/02/24 1801 02/03/24 0528  NA 137  --  137  K 3.4*  --  3.6  CL 99  --  105  CO2 25  --  22  GLUCOSE 91  --  96  BUN 26*  --  29*  CREATININE 0.92 0.95 0.82  CALCIUM 9.1  --  8.6*  MG  --  2.4  --    Liver Function Tests: No results for input(s): "AST", "ALT", "ALKPHOS", "BILITOT", "PROT", "ALBUMIN" in the last 168 hours. CBG: No results for input(s): "GLUCAP" in the last 168 hours.  Scheduled Meds:  [MAR Hold] amLODipine  5 mg Oral Daily   [START ON 02/04/2024] aspirin  81 mg Oral Pre-Cath   [MAR Hold] aspirin EC  81 mg Oral Daily   [MAR Hold] irbesartan  150 mg Oral Daily   [MAR Hold] metoprolol tartrate  50 mg Oral BID   [MAR Hold] rosuvastatin  40 mg Oral Daily   sodium chloride flush  3 mL Intravenous Q12H   Continuous Infusions:  sodium chloride     [START ON 02/04/2024] sodium chloride 3 mL/kg/hr (02/03/24 1306)   Followed by   Cecily Cohen ON 02/04/2024] sodium chloride     PRN Meds:.sodium chloride, [UUV  Hold] acetaminophen **OR** [MAR Hold] acetaminophen, aspirin, fentaNYL, Heparin (Porcine) in NaCl, heparin sodium (porcine), [MAR Hold] labetalol, lidocaine (PF), midazolam, [MAR Hold] nitroGLYCERIN, nitroGLYCERIN, [MAR Hold] ondansetron **OR** [MAR Hold] ondansetron (ZOFRAN) IV, prasugrel, sodium chloride flush, verapamil  Family Communication: None at bedside  Disposition: Status is: Inpatient Remains inpatient appropriate because: Unstable angina with left heart cath.     Time spent: 35 minutes  Author: Jodeane Mulligan, DO 02/03/2024 2:36 PM  For on call review [email or web address].

## 2024-02-03 NOTE — Hospital Course (Signed)
  53 year old male with history of hypertension, hyperlipidemia, GERD, CAD, who presents emergency department for chief concerns of chest pain from [facility] clinic .   Assessment and Plan:  Unstable angina - Followed in [facility] clinic, CTA noting significant stenosis in patient reported progressively worsening atypical symptoms.  Cardiology following closely.  Left heart cath later on this afternoon.  Troponins negative so far.  Other lab work unremarkable.  Awaiting cardiology recommendations post cath.  CAD/hypertension/hyperlipidemia -Aspirin, statin on board.  Continue amlodipine, irbesartan, metoprolol.   Essential hypertension Pending med reconciliation Home hydrochlorothiazide 25 mg daily, amlodipine-losartan 5-160 milligrams daily not resumed on admission Patient's antihypertensive medication will be modified by cardiology team Labetalol 5 mg IV every 3 hours as needed for SBP greater 170, 4 doses ordered

## 2024-02-03 NOTE — Consult Note (Signed)
 [facility] CARDIOLOGY CONSULT NOTE       Patient ID: Barry Thornton MRN: [id number] DOB/AGE: 04-12-1971 53 y.o.  Admit date: 02/02/2024 Referring Physician Dr. Ysidro Her Primary Physician Powell-Tillman, Levonne Genese, MD  Primary Cardiologist Dr. Bob Burn Reason for Consultation unstable angina  HPI: Barry Thornton is a 53 y.o. male  with a past medical history of coronary artery disease by CTA 01/2024, hypertension, hyperlipidemia who presented to the ED on 02/02/2024 for unstable angina. Cardiology was consulted for further evaluation.   Patient was seen in our clinic yesterday for follow-up of CTA which revealed significant stenosis, he reported progressively worsening atypical symptoms.  He was sent to the ED for further evaluation.  Workup in the ED notable for creatinine 0.92, potassium 3.4, hemoglobin 15.7, WBC 7.3.  Troponins normal x 2 at 6, 7.  EKG without acute ischemic changes.  Chest x-ray without acute abnormality.  Patient reports CP that has been getting worse over last 2 months.  States this has progressively become more more constant, describes it as a burning sensation.  Reports this is usually worse in AM. Not usually worse with exertion.  Endorses associated SOB, lightheadedness.  Denies any episodes of syncope.  States that the pain this morning is coming and going.  Prior to my evaluation he states it was about a 3-4 out of 10 and has resolved at the time of my evaluation.  Review of systems complete and found to be negative unless listed above    Past Medical History:  Diagnosis Date   Coronary artery disease    Hypercholesterolemia    Hypertension     Past Surgical History:  Procedure Laterality Date   CHOLECYSTECTOMY      (Not in a hospital admission)  Social History   Socioeconomic History   Marital status: Married    Spouse name: Not on file   Number of children: Not on file   Years of education: Not on file   Highest education level:  Not on file  Occupational History   Not on file  Tobacco Use   Smoking status: Never   Smokeless tobacco: Never  Vaping Use   Vaping status: Never Used  Substance and Sexual Activity   Alcohol use: Not Currently   Drug use: Not Currently   Sexual activity: Not Currently  Other Topics Concern   Not on file  Social History Narrative   Not on file   Social Drivers of Health   Financial Resource Strain: Low Risk  (12/30/2022)   [facility]   Overall Financial Resource Strain (CARDIA)    Difficulty of Paying Living Expenses: Not hard at all  Food Insecurity: No Food Insecurity (01/20/2024)   Received from [facility]   Hunger Vital Sign    Worried About Running Out of Food in the Last Year: Never true    Ran Out of Food in the Last Year: Never true  Transportation Needs: No Transportation Needs (01/20/2024)   Received from [facility]   PRAPARE - Transportation    Lack of Transportation (Medical): No    Lack of Transportation (Non-Medical): No  Physical Activity: Insufficiently Active (01/20/2024)   Received from [facility]   Exercise Vital Sign    Days of Exercise per Week: 4 days    Minutes of Exercise per Session: 30 min  Stress: No Stress Concern Present (01/20/2024)   Received from [facility] - Occupational  Stress Questionnaire    Feeling of Stress : Only a little  Social Connections: Not on file  Intimate Partner Violence: Not on file    Family History  Problem Relation Age of Onset   Hyperlipidemia Mother    Hyperlipidemia Maternal Grandmother      Vitals:   02/03/24 0630 02/03/24 0700 02/03/24 0730 02/03/24 0800  BP: (!) 138/94 128/85 (!) 136/91 (!) 132/96  Pulse: 73 67 63 73  Resp:      Temp:      TempSrc:      SpO2: 96% 95% 98% 96%  Weight:      Height:        PHYSICAL EXAM General: Well appearing male, well nourished, in no acute distress. HEENT: Normocephalic and  atraumatic. Neck: No JVD.  Lungs: Normal respiratory effort on room air. Clear bilaterally to auscultation. No wheezes, crackles, rhonchi.  Heart: HRRR. Normal S1 and S2 without gallops or murmurs.  Abdomen: Non-distended appearing.  Msk: Normal strength and tone for age. Extremities: Warm and well perfused. No clubbing, cyanosis. No edema.  Neuro: Alert and oriented X 3. Psych: Answers questions appropriately.   Labs: Basic Metabolic Panel: Recent Labs    02/02/24 1454 02/02/24 1801  NA 137  --   K 3.4*  --   CL 99  --   CO2 25  --   GLUCOSE 91  --   BUN 26*  --   CREATININE 0.92 0.95  CALCIUM 9.1  --   MG  --  2.4   Liver Function Tests: No results for input(s): "AST", "ALT", "ALKPHOS", "BILITOT", "PROT", "ALBUMIN" in the last 72 hours. No results for input(s): "LIPASE", "AMYLASE" in the last 72 hours. CBC: Recent Labs    02/02/24 1454 02/03/24 0528  WBC 7.3 6.7  HGB 15.7 14.7  HCT 45.8 41.1  MCV 88.2 86.3  PLT 280 248   Cardiac Enzymes: Recent Labs    02/02/24 1454 02/02/24 1801  TROPONINIHS 6 7   BNP: No results for input(s): "BNP" in the last 72 hours. D-Dimer: No results for input(s): "DDIMER" in the last 72 hours. Hemoglobin A1C: No results for input(s): "HGBA1C" in the last 72 hours. Fasting Lipid Panel: No results for input(s): "CHOL", "HDL", "LDLCALC", "TRIG", "CHOLHDL", "LDLDIRECT" in the last 72 hours. Thyroid Function Tests: No results for input(s): "TSH", "T4TOTAL", "T3FREE", "THYROIDAB" in the last 72 hours.  Invalid input(s): "FREET3" Anemia Panel: No results for input(s): "VITAMINB12", "FOLATE", "FERRITIN", "TIBC", "IRON", "RETICCTPCT" in the last 72 hours.   Radiology: DG Chest 2 View Result Date: 02/02/2024 CLINICAL DATA:  Chest pain. EXAM: CHEST - 2 VIEW COMPARISON:  08/10/2018. FINDINGS: Bilateral lung fields are clear. Bilateral costophrenic angles are clear. Note is made of elevated right hemidiaphragm. Normal cardio-mediastinal  silhouette. No acute osseous abnormalities. The soft tissues are within normal limits. There are surgical clips in the right upper quadrant, typical of a previous cholecystectomy. IMPRESSION: No active cardiopulmonary disease. Electronically Signed   By: Beula Brunswick M.D.   On: 02/02/2024 15:22   CT CORONARY FFR DATA PREP & FLUID ANALYSIS Result Date: 02/02/2024 EXAM: CT FFR ANALYSIS CLINICAL DATA:  Abnormal CCTA FINDINGS: FFRct analysis was performed on the original cardiac CT angiogram dataset. Diagrammatic representation of the FFRct analysis is provided in a separate PDF document in PACS. This dictation was created using the PDF document and an interactive 3D model of the results. 3D model is not available in the EMR/PACS. Normal FFR range is >0.80. 1.  Left Main:  No significant stenosis. 2. LAD: significant stenosis in the proximal-mid segment. FFRct 0.78. 3. LCX: No significant stenosis. 4. RCA: Significant proximal stenosis.  FFRct 0.58 IMPRESSION: 1. CT FFR analysis showed significant stenosis in the proximal RCA, proximal-mid LAD. 2.  Recommend cardiac catheterization. Electronically Signed   By: Constancia Delton M.D.   On: 02/02/2024 09:46   CT CORONARY MORPH W/CTA COR W/SCORE W/CA W/CM &/OR WO/CM Addendum Date: 02/01/2024 ADDENDUM REPORT: 02/01/2024 14:23 EXAM: OVER-READ INTERPRETATION  CT CHEST The following report is an over-read performed by radiologist Dr. Asenath Blacker [facility] on 02/01/2024. This over-read does not include interpretation of cardiac or coronary anatomy or pathology. The coronary CTA interpretation by the cardiologist is attached. COMPARISON:  None. FINDINGS: Heart is normal size. Aorta normal caliber. No adenopathy. Minimal dependent atelectasis in the right lower lobe. No confluent opacities or effusions. No acute findings in the upper abdomen. Chest wall soft tissues are unremarkable. No acute bony abnormality. IMPRESSION: No acute or significant  extracardiac abnormality. Electronically Signed   By: Janeece Mechanic M.D.   On: 02/01/2024 14:23   Result Date: 02/01/2024 CLINICAL DATA:  Chest pain EXAM: Cardiac/Coronary  CTA TECHNIQUE: The patient was scanned on a Siemens Somatom scanner. : A retrospective scan was triggered in the ascending thoracic aorta. Axial non-contrast 3 mm slices were carried out through the heart. The data set was analyzed on a dedicated work station and scored using the Agatson method. Gantry rotation speed was 66 msecs and collimation was .6 mm. 50mg  of metoprolol and 0.8 mg of sl NTG was given. The 3D data set was reconstructed in 5% intervals of the 60-95 % of the R-R cycle. Diastolic phases were analyzed on a dedicated work station using MPR, MIP and VRT modes. The patient received 80 cc of contrast. FINDINGS: Aorta:  Normal size.  Aortic wall calcifications.  No dissection. Aortic Valve:  Trileaflet.  No calcifications. Coronary Arteries:  Normal coronary origin.  Right dominance. RCA is a dominant artery. There is predominantly non calcified plaque in proximal RCA causing severe stenosis (>70%). Left main gives rise to LAD and LCX arteries. LM has no disease. LAD has calcified plaque proximally causing severe stenosis (>70%). LCX is a non-dominant artery.  There is no plaque. Other findings: Normal pulmonary vein drainage into the left atrium. Normal left atrial appendage without a thrombus. Normal size of the pulmonary artery. IMPRESSION: 1. Coronary calcium score of 643. This was 98th percentile for age and sex matched control. 2. Normal coronary origin with right dominance. 3. Severe stenosis in proximal RCA and LAD (>70%). 4. CAD-RADS 4 Severe stenosis. (70-99% or > 50% left main). Cardiac catheterization is recommended. Consider symptom-guided anti-ischemic pharmacotherapy as well as risk factor modification per guideline directed care. 5. Additional analysis with CT FFR will be submitted and reported separately.  Electronically Signed: By: Constancia Delton M.D. On: 02/01/2024 13:48    TELEMETRY reviewed by me 02/03/2024: sinus rhythm 1st degree   EKG reviewed by me: Sinus rhythm rate 85 bpm  Data reviewed by me 02/03/2024: last 24h vitals tele labs imaging I/O ED provider note, admission H&P  Principal Problem:   Unstable angina (HCC) Active Problems:   Essential hypertension   GERD (gastroesophageal reflux disease)   CAD (coronary artery disease)   Hyperlipidemia    ASSESSMENT AND PLAN:  Barry Thornton is a 53 y.o. male  with a past medical history of coronary artery disease by CTA 01/2024, hypertension, hyperlipidemia who  presented to the ED on 02/02/2024 for unstable angina. Cardiology was consulted for further evaluation.   # Unstable angina # Hypertension # Hyperlipidemia Patient with progressively worsening chest pain symptoms over the last 2 months.  CTA done 4/28 revealed significant proximal RCA and proximal to mid LAD stenosis.  He was sent to the ED from our clinic for heart catheterization as he was continuing to have symptoms.  Troponins negative x 2 at 6, 7.  EKG without acute ischemic changes. -S/p ASA 325 in the ED.  Continue aspirin 81 mg daily. -Increase home Crestor dose to 40 mg daily. -Continue home BP regimen of amlodipine 5 mg daily, irbesartan 150 mg daily (in place of valsartan), metoprolol tartrate 50 mg twice daily -Discussed the risks and benefits of proceeding with LHC for further evaluation with the patient.  He is agreeable to proceed.  NPO until LHC this afternoon (/30/2025) with Dr. Philippa Bray.  Written consent will be obtained.  Further recommendations following LHC.     TIMI Risk Score for Unstable Angina or Non-ST Elevation MI:   The patient's TIMI risk score is 3, which indicates a 13% risk of all cause mortality, new or recurrent myocardial infarction or need for urgent revascularization in the next 14 days.   This patient's plan of care was discussed and  created with Dr. Parks Bollman and he is in agreement.  Signed: Hamp Levine, PA-C  02/03/2024, 8:18 AM [facility]

## 2024-02-03 NOTE — ED Notes (Signed)
 Pt ambulated to restroom without assistance, gait steady; upon entering room, pt reporting burning sensation in middle of chest. Denies radiation, sob, or n/v. Pain subsided shortly after returning to bed and resting. Pt offered prn medication, denied need for intervention at this time.  Pt reports he usually has pain in the morning.

## 2024-02-03 NOTE — Progress Notes (Signed)
 TR band removed at 1642. Hand elevated while pressure was held. The total time of holding pressure on hematoma was about .

## 2024-02-04 DIAGNOSIS — I10 Essential (primary) hypertension: Secondary | ICD-10-CM | POA: Diagnosis not present

## 2024-02-04 DIAGNOSIS — Z955 Presence of coronary angioplasty implant and graft: Secondary | ICD-10-CM

## 2024-02-04 DIAGNOSIS — I25110 Atherosclerotic heart disease of native coronary artery with unstable angina pectoris: Secondary | ICD-10-CM

## 2024-02-04 LAB — BASIC METABOLIC PANEL WITH GFR
Anion gap: 6 (ref 5–15)
BUN: 24 mg/dL — ABNORMAL HIGH (ref 6–20)
CO2: 25 mmol/L (ref 22–32)
Calcium: 8.8 mg/dL — ABNORMAL LOW (ref 8.9–10.3)
Chloride: 106 mmol/L (ref 98–111)
Creatinine, Ser: 0.92 mg/dL (ref 0.61–1.24)
GFR, Estimated: >60 mL/min (ref >60)
Glucose, Bld: 81 mg/dL (ref 70–99)
Potassium: 4.3 mmol/L (ref 3.5–5.1)
Sodium: 137 mmol/L (ref 135–145)

## 2024-02-04 LAB — CBC
HCT: 40.4 % (ref 39.0–52.0)
Hemoglobin: 13.8 g/dL (ref 13.0–17.0)
MCH: 30.1 pg (ref 26.0–34.0)
MCHC: 34.2 g/dL (ref 30.0–36.0)
MCV: 88 fL (ref 80.0–100.0)
Platelets: 239 10*3/uL (ref 150–400)
RBC: 4.59 MIL/uL (ref 4.22–5.81)
RDW: 11.7 % (ref 11.5–15.5)
WBC: 8.6 10*3/uL (ref 4.0–10.5)
nRBC: 0 % (ref 0.0–0.2)

## 2024-02-04 MED ORDER — ROSUVASTATIN CALCIUM 40 MG PO TABS: 40.0000 mg | ORAL_TABLET | Freq: Every day | ORAL | 0 refills | Status: AC

## 2024-02-04 MED ORDER — METOPROLOL TARTRATE 50 MG PO TABS: 50.0000 mg | ORAL_TABLET | Freq: Two times a day (BID) | ORAL | 0 refills | Status: AC

## 2024-02-04 MED ORDER — IRBESARTAN 150 MG PO TABS: 150.0000 mg | ORAL_TABLET | Freq: Every day | ORAL | 0 refills | Status: AC

## 2024-02-04 MED ORDER — ASPIRIN 81 MG PO TBEC: 81.0000 mg | DELAYED_RELEASE_TABLET | Freq: Every day | ORAL | 12 refills | Status: AC

## 2024-02-04 MED ORDER — AMLODIPINE BESYLATE 5 MG PO TABS: 5.0000 mg | ORAL_TABLET | Freq: Every day | ORAL | 0 refills | Status: AC

## 2024-02-04 MED ORDER — PRASUGREL HCL 10 MG PO TABS: 10.0000 mg | ORAL_TABLET | Freq: Every day | ORAL | 0 refills | Status: DC

## 2024-02-04 NOTE — Discharge Summary (Signed)
 Physician Discharge Summary   Patient: Barry Thornton MRN: [id number] DOB: 1970/12/22  Admit date:     02/02/2024  Discharge date: 02/04/24  Discharge Physician: Jodeane Mulligan   PCP: Powell-Tillman, Levonne Genese, MD   Recommendations at discharge:    Pt to be discharged home.   If you experience worsening fever, chills, chest pain, shortness of breath, or other concerning symptoms, please call your PCP or go to the emergency department immediately.  Discharge Diagnoses: Principal Problem:   Unstable angina (HCC) Active Problems:   Essential hypertension   GERD (gastroesophageal reflux disease)   CAD (coronary artery disease)   Hyperlipidemia  Resolved Problems:   * No resolved hospital problems. *   Hospital Course:   53 year old male with history of hypertension, hyperlipidemia, GERD, CAD, who presents emergency department for chief concerns of chest pain from [facility] clinic .   Assessment and Plan:  Unstable angina - Followed in [facility], CTA noting significant stenosis in patient reported progressively worsening atypical symptoms.  Cardiology following closely.  Left heart cath resulting in stent to RCA and LAD.  Patient will DAPT with aspirin  plus prasugrel  x 12 months.  Patient to follow with cardiac rehab and cardiology in the outpatient setting.  CAD/hypertension/hyperlipidemia - Statin increased to 40 mg daily.  Continue amlodipine , irbesartan , metoprolol  per cardiology recommendations.   Left radial artery access - Some initial bruising postprocedure.  Repeat pressure and new dressing applied with hemostasis.  Patient advised to not use the left hand.  Consultants: Cardiology Procedures performed: Left heart cath with PCI Disposition: Home Diet recommendation:  Discharge Diet Orders (From admission, onward)     Start     Ordered   02/04/24 0000  Diet - low sodium heart healthy        02/04/24 1348           Cardiac diet  DISCHARGE  MEDICATION: Allergies as of 02/04/2024   No Known Allergies      Medication List     STOP taking these medications    amLODipine -valsartan 5-160 MG tablet Commonly known as: EXFORGE   hydrochlorothiazide 25 MG tablet Commonly known as: HYDRODIURIL       TAKE these medications    amLODipine  5 MG tablet Commonly known as: NORVASC  Take 1 tablet (5 mg total) by mouth daily. Start taking on: Feb 05, 2024   aspirin  EC 81 MG tablet Take 1 tablet (81 mg total) by mouth daily. Swallow whole. Start taking on: Feb 05, 2024   esomeprazole 40 MG capsule Commonly known as: NEXIUM Take 40 mg by mouth daily at 12 noon.   irbesartan  150 MG tablet Commonly known as: AVAPRO  Take 1 tablet (150 mg total) by mouth daily. Start taking on: Feb 05, 2024   metoprolol  tartrate 50 MG tablet Commonly known as: LOPRESSOR  Take 1 tablet (50 mg total) by mouth 2 (two) times daily.   nitroGLYCERIN  0.4 MG SL tablet Commonly known as: NITROSTAT  Place 0.4 mg under the tongue every 5 (five) minutes as needed for chest pain.   pantoprazole 40 MG tablet Commonly known as: PROTONIX Take 40 mg by mouth daily with breakfast.   prasugrel  10 MG Tabs tablet Commonly known as: EFFIENT  Take 1 tablet (10 mg total) by mouth daily. Start taking on: Feb 05, 2024   rosuvastatin  40 MG tablet Commonly known as: CRESTOR  Take 1 tablet (40 mg total) by mouth daily. Start taking on: Feb 05, 2024 What changed:  medication strength how much to take  when to take this   triamcinolone cream 0.1 % Commonly known as: KENALOG Apply 1 Application topically 2 (two) times daily.        Follow-up Information     Alluri, Odessa Bene, MD. Go in 1 week(s).   Specialty: Cardiology Contact information: [address] [place] [address] [phone]                 Discharge Exam: Barry Thornton   02/02/24 1450  Weight: 77.1 kg    GENERAL:  Alert, pleasant, no acute distress  HEENT:   EOMI CARDIOVASCULAR:  RRR, no murmurs appreciated RESPIRATORY:  Clear to auscultation, no wheezing, rales, or rhonchi GASTROINTESTINAL:  Soft, nontender, nondistended EXTREMITIES:  No LE edema bilaterally NEURO:  No new focal deficits appreciated SKIN:  No rashes noted PSYCH:  Appropriate mood and affect    Condition at discharge: improving  The results of significant diagnostics from this hospitalization (including imaging, microbiology, ancillary and laboratory) are listed below for reference.   Imaging Studies: DG Chest 2 View Result Date: 02/02/2024 CLINICAL DATA:  Chest pain. EXAM: CHEST - 2 VIEW COMPARISON:  08/10/2018. FINDINGS: Bilateral lung fields are clear. Bilateral costophrenic angles are clear. Note is made of elevated right hemidiaphragm. Normal cardio-mediastinal silhouette. No acute osseous abnormalities. The soft tissues are within normal limits. There are surgical clips in the right upper quadrant, typical of a previous cholecystectomy. IMPRESSION: No active cardiopulmonary disease. Electronically Signed   By: Beula Brunswick M.D.   On: 02/02/2024 15:22   CT CORONARY FFR DATA PREP & FLUID ANALYSIS Result Date: 02/02/2024 EXAM: CT FFR ANALYSIS CLINICAL DATA:  Abnormal CCTA FINDINGS: FFRct analysis was performed on the original cardiac CT angiogram dataset. Diagrammatic representation of the FFRct analysis is provided in a separate PDF document in PACS. This dictation was created using the PDF document and an interactive 3D model of the results. 3D model is not available in the EMR/PACS. Normal FFR range is >0.80. 1. Left Main:  No significant stenosis. 2. LAD: significant stenosis in the proximal-mid segment. FFRct 0.78. 3. LCX: No significant stenosis. 4. RCA: Significant proximal stenosis.  FFRct 0.58 IMPRESSION: 1. CT FFR analysis showed significant stenosis in the proximal RCA, proximal-mid LAD. 2.  Recommend cardiac catheterization. Electronically Signed   By: Constancia Delton M.D.   On: 02/02/2024 09:46   CT CORONARY MORPH W/CTA COR W/SCORE W/CA W/CM &/OR WO/CM Addendum Date: 02/01/2024 ADDENDUM REPORT: 02/01/2024 14:23 EXAM: OVER-READ INTERPRETATION  CT CHEST The following report is an over-read performed by radiologist Dr. Asenath Blacker [facility] on 02/01/2024. This over-read does not include interpretation of cardiac or coronary anatomy or pathology. The coronary CTA interpretation by the cardiologist is attached. COMPARISON:  None. FINDINGS: Heart is normal size. Aorta normal caliber. No adenopathy. Minimal dependent atelectasis in the right lower lobe. No confluent opacities or effusions. No acute findings in the upper abdomen. Chest wall soft tissues are unremarkable. No acute bony abnormality. IMPRESSION: No acute or significant extracardiac abnormality. Electronically Signed   By: Janeece Mechanic M.D.   On: 02/01/2024 14:23   Result Date: 02/01/2024 CLINICAL DATA:  Chest pain EXAM: Cardiac/Coronary  CTA TECHNIQUE: The patient was scanned on a Siemens Somatom scanner. : A retrospective scan was triggered in the ascending thoracic aorta. Axial non-contrast 3 mm slices were carried out through the heart. The data set was analyzed on a dedicated work station and scored using the Agatson method. Gantry rotation speed was 66 msecs and  collimation was .6 mm. 50mg  of metoprolol  and 0.8 mg of sl NTG was given. The 3D data set was reconstructed in 5% intervals of the 60-95 % of the R-R cycle. Diastolic phases were analyzed on a dedicated work station using MPR, MIP and VRT modes. The patient received 80 cc of contrast. FINDINGS: Aorta:  Normal size.  Aortic wall calcifications.  No dissection. Aortic Valve:  Trileaflet.  No calcifications. Coronary Arteries:  Normal coronary origin.  Right dominance. RCA is a dominant artery. There is predominantly non calcified plaque in proximal RCA causing severe stenosis (>70%). Left main gives rise to LAD and LCX arteries.  LM has no disease. LAD has calcified plaque proximally causing severe stenosis (>70%). LCX is a non-dominant artery.  There is no plaque. Other findings: Normal pulmonary vein drainage into the left atrium. Normal left atrial appendage without a thrombus. Normal size of the pulmonary artery. IMPRESSION: 1. Coronary calcium  score of 643. This was 98th percentile for age and sex matched control. 2. Normal coronary origin with right dominance. 3. Severe stenosis in proximal RCA and LAD (>70%). 4. CAD-RADS 4 Severe stenosis. (70-99% or > 50% left main). Cardiac catheterization is recommended. Consider symptom-guided anti-ischemic pharmacotherapy as well as risk factor modification per guideline directed care. 5. Additional analysis with CT FFR will be submitted and reported separately. Electronically Signed: By: Constancia Delton M.D. On: 02/01/2024 13:48    Microbiology: No results found for this or any previous visit.  Labs: CBC: Recent Labs  Lab 02/02/24 1454 02/03/24 0528 02/04/24 0520  WBC 7.3 6.7 8.6  HGB 15.7 14.7 13.8  HCT 45.8 41.1 40.4  MCV 88.2 86.3 88.0  PLT 280 248 239   Basic Metabolic Panel: Recent Labs  Lab 02/02/24 1454 02/02/24 1801 02/03/24 0528 02/04/24 0520  NA 137  --  137 137  K 3.4*  --  3.6 4.3  CL 99  --  105 106  CO2 25  --  22 25  GLUCOSE 91  --  96 81  BUN 26*  --  29* 24*  CREATININE 0.92 0.95 0.82 0.92  CALCIUM  9.1  --  8.6* 8.8*  MG  --  2.4  --   --    Liver Function Tests: No results for input(s): "AST", "ALT", "ALKPHOS", "BILITOT", "PROT", "ALBUMIN" in the last 168 hours. CBG: No results for input(s): "GLUCAP" in the last 168 hours.  Discharge time spent: 39 minutes.  Signed: Jodeane Mulligan, DO [facility] 02/04/2024

## 2024-02-04 NOTE — TOC Initial Note (Addendum)
 Transition of Care [facility]) - Initial/Assessment Note    Patient Details  Name: Barry Thornton MRN: [id number] Date of Birth: 02/24/1971  [facility] CM/SW Contact:    Curby Carswell C Jamesen Stahnke, RN Phone Number: 02/04/2024, 2:44 PM  Clinical Narrative:                 TOC continuing to follow patient's progress throughout discharge planning.        Patient Goals and CMS Choice            Expected Discharge Plan and Services         Expected Discharge Date: 02/04/24                                    Prior Living Arrangements/Services                       Activities of Daily Living   ADL Screening (condition at time of admission) Independently performs ADLs?: Yes (appropriate for developmental age) Is the patient deaf or have difficulty hearing?: No Does the patient have difficulty seeing, even when wearing glasses/contacts?: No Does the patient have difficulty concentrating, remembering, or making decisions?: No  Permission Sought/Granted                  Emotional Assessment              Admission diagnosis:  Unstable angina (HCC) [I20.0] Patient Active Problem List   Diagnosis Date Noted   Unstable angina (HCC) 02/02/2024   Essential hypertension 02/02/2024   GERD (gastroesophageal reflux disease) 02/02/2024   CAD (coronary artery disease) 02/02/2024   Hyperlipidemia 02/02/2024   PCP:  Powell-Tillman, Levonne Genese, MD Pharmacy:   [facility] [id number] [place], [place] - [address] [place] [address] Phone: [phone] Fax: [phone]     Social Drivers of Health (SDOH) Social History: SDOH Screenings   Food Insecurity: No Food Insecurity (02/03/2024)  Housing: Low Risk  (02/03/2024)  Transportation Needs: No Transportation Needs (02/03/2024)  Utilities: Not At Risk (02/03/2024)  Financial Resource Strain: Low Risk  (12/30/2022)   Received from [facility]  Physical  Activity: Insufficiently Active (01/20/2024)   Received from [facility]  Social Connections: Unknown (02/03/2024)  Stress: No Stress Concern Present (01/20/2024)   [facility]  Tobacco Use: Low Risk  (02/03/2024)  Recent Concern: Tobacco Use - Medium Risk (01/20/2024)   Received from [facility]   SDOH Interventions:     Readmission Risk Interventions     No data to display

## 2024-02-04 NOTE — Progress Notes (Signed)
 [facility] CLINIC CARDIOLOGY PROGRESS NOTE       Patient ID: Barry Thornton MRN: [id number] DOB/AGE: [birth year] 53 y.o.  Admit date: 02/02/2024 Referring Physician Dr. Ysidro Thornton Primary Physician Powell-Tillman, Levonne Genese, MD  Primary Cardiologist Dr. Bob Thornton Reason for Consultation unstable angina  HPI: Barry Thornton is a 53 y.o. male  with a past medical history of coronary artery disease by CTA 01/2024, hypertension, hyperlipidemia who presented to the ED on 02/02/2024 for unstable angina. Cardiology was consulted for further evaluation.   Interval history: -Patient seen and examined this AM, denies any chest pain or SOB.  -Having some oozing from L radial access site with associated bruising.  -Renal function and hemoglobin stable this AM.   Review of systems complete and found to be negative unless listed above    Past Medical History:  Diagnosis Date   Coronary artery disease    Hypercholesterolemia    Hypertension     Past Surgical History:  Procedure Laterality Date   CHOLECYSTECTOMY      Medications Prior to Admission  Medication Sig Dispense Refill Last Dose/Taking   amLODipine -valsartan (EXFORGE) 5-160 MG tablet Take 1 tablet by mouth daily.   02/01/2024   esomeprazole (NEXIUM) 40 MG capsule Take 40 mg by mouth daily at 12 noon.   02/01/2024   hydrochlorothiazide (HYDRODIURIL) 25 MG tablet Take 25 mg by mouth daily.   02/01/2024   nitroGLYCERIN  (NITROSTAT ) 0.4 MG SL tablet Place 0.4 mg under the tongue every 5 (five) minutes as needed for chest pain.   Unknown   pantoprazole (PROTONIX) 40 MG tablet Take 40 mg by mouth daily with breakfast.   02/01/2024   rosuvastatin  (CRESTOR ) 5 MG tablet Take 5 mg by mouth at bedtime.   02/01/2024   triamcinolone cream (KENALOG) 0.1 % Apply 1 Application topically 2 (two) times daily.   02/01/2024   Social History   Socioeconomic History   Marital status: Married    Spouse name: Not on file   Number of children: Not on  file   Years of education: Not on file   Highest education level: Not on file  Occupational History   Not on file  Tobacco Use   Smoking status: Never   Smokeless tobacco: Never  Vaping Use   Vaping status: Never Used  Substance and Sexual Activity   Alcohol use: Not Currently   Drug use: Not Currently   Sexual activity: Not Currently  Other Topics Concern   Not on file  Social History Narrative   Not on file   Social Drivers of Health   Financial Resource Strain: Low Risk  (12/30/2022)   Received from [facility]   Overall Financial Resource Strain (CARDIA)    Difficulty of Paying Living Expenses: Not hard at all  Food Insecurity: No Food Insecurity (02/03/2024)   Hunger Vital Sign    Worried About Running Out of Food in the Last Year: Never true    Ran Out of Food in the Last Year: Never true  Transportation Needs: No Transportation Needs (02/03/2024)   PRAPARE - Administrator, Civil Service (Medical): No    Lack of Transportation (Non-Medical): No  Physical Activity: Insufficiently Active (01/20/2024)   Received from [facility]   Exercise Vital Sign    Days of Exercise per Week: 4 days    Minutes of Exercise per Session: 30 min  Stress: No Stress Concern Present (01/20/2024)   Received from [facility]  of Occupational Health - Occupational Stress Questionnaire    Feeling of Stress : Only a little  Social Connections: Unknown (02/03/2024)   Social Connection and Isolation Panel [NHANES]    Frequency of Communication with Friends and Family: Not on file    Frequency of Social Gatherings with Friends and Family: Not on file    Attends Religious Services: Not on file    Active Member of Clubs or Organizations: Not on file    Attends Banker Meetings: Not on file    Marital Status: Married  Intimate Partner Violence: Not At Risk (02/03/2024)   Humiliation, Afraid, Rape, and Kick questionnaire    Fear of Current or  Ex-Partner: No    Emotionally Abused: No    Physically Abused: No    Sexually Abused: No    Family History  Problem Relation Age of Onset   Hyperlipidemia Mother    Hyperlipidemia Maternal Grandmother      Vitals:   02/03/24 1922 02/03/24 2320 02/04/24 0403 02/04/24 0716  BP: (!) 146/102 132/84 138/85 (!) 136/92  Pulse: 68 70 63 67  Resp: 20 18 18 18   Temp: 98 F (36.7 C) (!) 97.5 F (36.4 C) 97.7 F (36.5 C) 97.7 F (36.5 C)  TempSrc:      SpO2: 99% 96% 97% 98%  Weight:      Height:        PHYSICAL EXAM General: Well appearing male, well nourished, in no acute distress. HEENT: Normocephalic and atraumatic. Neck: No JVD.  Lungs: Normal respiratory effort on room air. Clear bilaterally to auscultation. No wheezes, crackles, rhonchi.  Heart: HRRR. Normal S1 and S2 without gallops or murmurs.  Abdomen: Non-distended appearing.  Msk: Normal strength and tone for age. Extremities: Warm and well perfused. No clubbing, cyanosis. No edema.  Neuro: Alert and oriented X 3. Psych: Answers questions appropriately.   Labs: Basic Metabolic Panel: Recent Labs    02/02/24 1801 02/03/24 0528 02/04/24 0520  NA  --  137 137  K  --  3.6 4.3  CL  --  105 106  CO2  --  22 25  GLUCOSE  --  96 81  BUN  --  29* 24*  CREATININE 0.95 0.82 0.92  CALCIUM   --  8.6* 8.8*  MG 2.4  --   --    Liver Function Tests: No results for input(s): "AST", "ALT", "ALKPHOS", "BILITOT", "PROT", "ALBUMIN" in the last 72 hours. No results for input(s): "LIPASE", "AMYLASE" in the last 72 hours. CBC: Recent Labs    02/03/24 0528 02/04/24 0520  WBC 6.7 8.6  HGB 14.7 13.8  HCT 41.1 40.4  MCV 86.3 88.0  PLT 248 239   Cardiac Enzymes: Recent Labs    02/02/24 1454 02/02/24 1801  TROPONINIHS 6 7   BNP: No results for input(s): "BNP" in the last 72 hours. D-Dimer: No results for input(s): "DDIMER" in the last 72 hours. Hemoglobin A1C: No results for input(s): "HGBA1C" in the last 72  hours. Fasting Lipid Panel: No results for input(s): "CHOL", "HDL", "LDLCALC", "TRIG", "CHOLHDL", "LDLDIRECT" in the last 72 hours. Thyroid Function Tests: No results for input(s): "TSH", "T4TOTAL", "T3FREE", "THYROIDAB" in the last 72 hours.  Invalid input(s): "FREET3" Anemia Panel: No results for input(s): "VITAMINB12", "FOLATE", "FERRITIN", "TIBC", "IRON", "RETICCTPCT" in the last 72 hours.   Radiology: DG Chest 2 View Result Date: 02/02/2024 CLINICAL DATA:  Chest pain. EXAM: CHEST - 2 VIEW COMPARISON:  08/10/2018. FINDINGS: Bilateral lung fields are clear.  Bilateral costophrenic angles are clear. Note is made of elevated right hemidiaphragm. Normal cardio-mediastinal silhouette. No acute osseous abnormalities. The soft tissues are within normal limits. There are surgical clips in the right upper quadrant, typical of a previous cholecystectomy. IMPRESSION: No active cardiopulmonary disease. Electronically Signed   By: Beula Brunswick M.D.   On: 02/02/2024 15:22   CT CORONARY FFR DATA PREP & FLUID ANALYSIS Result Date: 02/02/2024 EXAM: CT FFR ANALYSIS CLINICAL DATA:  Abnormal CCTA FINDINGS: FFRct analysis was performed on the original cardiac CT angiogram dataset. Diagrammatic representation of the FFRct analysis is provided in a separate PDF document in PACS. This dictation was created using the PDF document and an interactive 3D model of the results. 3D model is not available in the EMR/PACS. Normal FFR range is >0.80. 1. Left Main:  No significant stenosis. 2. LAD: significant stenosis in the proximal-mid segment. FFRct 0.78. 3. LCX: No significant stenosis. 4. RCA: Significant proximal stenosis.  FFRct 0.58 IMPRESSION: 1. CT FFR analysis showed significant stenosis in the proximal RCA, proximal-mid LAD. 2.  Recommend cardiac catheterization. Electronically Signed   By: Constancia Delton M.D.   On: 02/02/2024 09:46   CT CORONARY MORPH W/CTA COR W/SCORE W/CA W/CM &/OR WO/CM Addendum Date:  02/01/2024 ADDENDUM REPORT: 02/01/2024 14:23 EXAM: OVER-READ INTERPRETATION  CT CHEST The following report is an over-read performed by radiologist Dr. Asenath Blacker [facility] on 02/01/2024. This over-read does not include interpretation of cardiac or coronary anatomy or pathology. The coronary CTA interpretation by the cardiologist is attached. COMPARISON:  None. FINDINGS: Heart is normal size. Aorta normal caliber. No adenopathy. Minimal dependent atelectasis in the right lower lobe. No confluent opacities or effusions. No acute findings in the upper abdomen. Chest wall soft tissues are unremarkable. No acute bony abnormality. IMPRESSION: No acute or significant extracardiac abnormality. Electronically Signed   By: Janeece Mechanic M.D.   On: 02/01/2024 14:23   Result Date: 02/01/2024 CLINICAL DATA:  Chest pain EXAM: Cardiac/Coronary  CTA TECHNIQUE: The patient was scanned on a Siemens Somatom scanner. : A retrospective scan was triggered in the ascending thoracic aorta. Axial non-contrast 3 mm slices were carried out through the heart. The data set was analyzed on a dedicated work station and scored using the Agatson method. Gantry rotation speed was 66 msecs and collimation was .6 mm. 50mg  of metoprolol  and 0.8 mg of sl NTG was given. The 3D data set was reconstructed in 5% intervals of the 60-95 % of the R-R cycle. Diastolic phases were analyzed on a dedicated work station using MPR, MIP and VRT modes. The patient received 80 cc of contrast. FINDINGS: Aorta:  Normal size.  Aortic wall calcifications.  No dissection. Aortic Valve:  Trileaflet.  No calcifications. Coronary Arteries:  Normal coronary origin.  Right dominance. RCA is a dominant artery. There is predominantly non calcified plaque in proximal RCA causing severe stenosis (>70%). Left main gives rise to LAD and LCX arteries. LM has no disease. LAD has calcified plaque proximally causing severe stenosis (>70%). LCX is a non-dominant artery.   There is no plaque. Other findings: Normal pulmonary vein drainage into the left atrium. Normal left atrial appendage without a thrombus. Normal size of the pulmonary artery. IMPRESSION: 1. Coronary calcium  score of 643. This was 98th percentile for age and sex matched control. 2. Normal coronary origin with right dominance. 3. Severe stenosis in proximal RCA and LAD (>70%). 4. CAD-RADS 4 Severe stenosis. (70-99% or > 50% left main). Cardiac catheterization  is recommended. Consider symptom-guided anti-ischemic pharmacotherapy as well as risk factor modification per guideline directed care. 5. Additional analysis with CT FFR will be submitted and reported separately. Electronically Signed: By: Constancia Delton M.D. On: 02/01/2024 13:48    TELEMETRY reviewed by me 02/04/2024: sinus rhythm 1st degree rate 60s  EKG reviewed by me: Sinus rhythm rate 85 bpm  Data reviewed by me 02/04/2024: last 24h vitals tele labs imaging I/O hospitalist progress note  Principal Problem:   Unstable angina (HCC) Active Problems:   Essential hypertension   GERD (gastroesophageal reflux disease)   CAD (coronary artery disease)   Hyperlipidemia    ASSESSMENT AND PLAN:  Cristiano Akkerman is a 53 y.o. male  with a past medical history of coronary artery disease by CTA 01/2024, hypertension, hyperlipidemia who presented to the ED on 02/02/2024 for unstable angina. Cardiology was consulted for further evaluation.   # Unstable angina # Hypertension # Hyperlipidemia Patient with progressively worsening chest pain symptoms over the last 2 months.  CTA done 4/28 revealed significant proximal RCA and proximal to mid LAD stenosis.  He was sent to the ED from our clinic for heart catheterization as he was continuing to have symptoms.  Troponins negative x 2 at 6, 7.  EKG without acute ischemic changes. LHC 02/03/24 with 80% mid LAD lesion s/p 2.0 x 22 mm Onyx DES, 90% proximal to mid RCA lesion s/p 4.0 x 38 mm Onyx DES -S/p ASA 325 in  the ED.  Continue aspirin  81 mg daily and effient  10 mg daily.  Plan for DAPT uninterrupted for 12 months. -Continue Crestor  dose to 40 mg daily.  -Continue home BP regimen of amlodipine  5 mg daily, irbesartan  150 mg daily (in place of valsartan), metoprolol  tartrate 50 mg twice daily. -Dressing in place over left radial access site, had some bruising this a.m.  Advised to not use left arm.  If upon reassessment later today the left radial access site is without oozing then can consider discharge home with follow-up in clinic in 1 week.  This patient's plan of care was discussed and created with Dr. Parks Bollman and he is in agreement.  Signed: Hamp Levine, PA-C  02/04/2024, 9:54 AM [facility]

## 2024-02-04 NOTE — Progress Notes (Signed)
 Patient's left wrist dressing changed 3 times during shift.  Last dressing change at 0530.  Saturated dressing removed, skin cleansed.  Applied surgicel, covered with 4x4 gauze, secured with tegaderms and wrapped with coban.

## 2024-02-04 NOTE — Plan of Care (Signed)

## 2024-02-05 LAB — LIPOPROTEIN A (LPA): Lipoprotein (a): 107.3 nmol/L — ABNORMAL HIGH (ref <75.0)

## 2024-10-12 ENCOUNTER — Ambulatory Visit: Admission: RE | Admit: 2024-10-12 | Discharge: 2024-10-12 | Disposition: A | Discharge status: Home / Self Care

## 2024-10-12 ENCOUNTER — Encounter: Admission: RE | Disposition: A | Payer: Self-pay | Source: Home / Self Care

## 2024-10-12 ENCOUNTER — Other Ambulatory Visit: Payer: Self-pay

## 2024-10-12 DIAGNOSIS — Y831 Surgical operation with implant of artificial internal device as the cause of abnormal reaction of the patient, or of later complication, without mention of misadventure at the time of the procedure: Secondary | ICD-10-CM | POA: Insufficient documentation

## 2024-10-12 DIAGNOSIS — Z7902 Long term (current) use of antithrombotics/antiplatelets: Secondary | ICD-10-CM | POA: Insufficient documentation

## 2024-10-12 DIAGNOSIS — E7800 Pure hypercholesterolemia, unspecified: Secondary | ICD-10-CM | POA: Diagnosis not present

## 2024-10-12 DIAGNOSIS — Z955 Presence of coronary angioplasty implant and graft: Secondary | ICD-10-CM | POA: Insufficient documentation

## 2024-10-12 DIAGNOSIS — I10 Essential (primary) hypertension: Secondary | ICD-10-CM | POA: Insufficient documentation

## 2024-10-12 DIAGNOSIS — T82855A Stenosis of coronary artery stent, initial encounter: Secondary | ICD-10-CM | POA: Insufficient documentation

## 2024-10-12 DIAGNOSIS — I2510 Atherosclerotic heart disease of native coronary artery without angina pectoris: Secondary | ICD-10-CM | POA: Insufficient documentation

## 2024-10-12 DIAGNOSIS — R9430 Abnormal result of cardiovascular function study, unspecified: Secondary | ICD-10-CM | POA: Diagnosis not present

## 2024-10-12 HISTORY — PX: CORONARY ULTRASOUND/IVUS: CATH118244

## 2024-10-12 HISTORY — PX: CORONARY PRESSURE/FFR STUDY: CATH118243

## 2024-10-12 HISTORY — PX: LEFT HEART CATH AND CORONARY ANGIOGRAPHY: CATH118249

## 2024-10-12 HISTORY — PX: CORONARY STENT INTERVENTION: CATH118234

## 2024-10-12 LAB — POCT ACTIVATED CLOTTING TIME: Activated Clotting Time: 291 s

## 2024-10-12 MED ORDER — SODIUM CHLORIDE 0.9 % IV SOLN
250.0000 mL | INTRAVENOUS | Status: DC | PRN
  Administered 2024-10-12: 250 mL via INTRAVENOUS

## 2024-10-12 MED ORDER — ASPIRIN 81 MG PO CHEW: 81.0000 mg | CHEWABLE_TABLET | Freq: Every day | ORAL | Status: DC

## 2024-10-12 MED ORDER — FENTANYL CITRATE (PF) 100 MCG/2ML IJ SOLN
INTRAMUSCULAR | Status: DC | PRN
  Administered 2024-10-12: 50 ug via INTRAVENOUS

## 2024-10-12 MED ORDER — MIDAZOLAM HCL 2 MG/2ML IJ SOLN
INTRAMUSCULAR | Status: AC
  Filled 2024-10-12: qty 2

## 2024-10-12 MED ORDER — SODIUM CHLORIDE 0.9% FLUSH: 3.0000 mL | Freq: Two times a day (BID) | INTRAVENOUS | Status: DC

## 2024-10-12 MED ORDER — CLOPIDOGREL BISULFATE 75 MG PO TABS: 75.0000 mg | ORAL_TABLET | Freq: Every day | ORAL | Status: DC

## 2024-10-12 MED ORDER — HEPARIN (PORCINE) IN NACL 1000-0.9 UT/500ML-% IV SOLN
INTRAVENOUS | Status: AC
  Filled 2024-10-12: qty 1000

## 2024-10-12 MED ORDER — ASPIRIN 81 MG PO CHEW
CHEWABLE_TABLET | ORAL | Status: AC
  Filled 2024-10-12: qty 1

## 2024-10-12 MED ORDER — ACETAMINOPHEN 325 MG PO TABS: 650.0000 mg | ORAL_TABLET | ORAL | Status: DC | PRN

## 2024-10-12 MED ORDER — ONDANSETRON HCL 4 MG/2ML IJ SOLN: 4.0000 mg | Freq: Four times a day (QID) | INTRAMUSCULAR | Status: DC | PRN

## 2024-10-12 MED ORDER — SODIUM CHLORIDE 0.9 % IV SOLN: 250.0000 mL | INTRAVENOUS | Status: DC | PRN

## 2024-10-12 MED ORDER — HEPARIN SODIUM (PORCINE) 1000 UNIT/ML IJ SOLN
INTRAMUSCULAR | Status: DC | PRN
  Administered 2024-10-12: 2000 [IU] via INTRAVENOUS
  Administered 2024-10-12: 5000 [IU] via INTRAVENOUS
  Administered 2024-10-12: 4000 [IU] via INTRAVENOUS

## 2024-10-12 MED ORDER — IOHEXOL 300 MG/ML  SOLN
INTRAMUSCULAR | Status: DC | PRN
  Administered 2024-10-12: 88 mL

## 2024-10-12 MED ORDER — VERAPAMIL HCL 2.5 MG/ML IV SOLN
INTRAVENOUS | Status: DC | PRN
  Administered 2024-10-12: 2.5 mg via INTRA_ARTERIAL

## 2024-10-12 MED ORDER — SODIUM CHLORIDE 0.9% FLUSH: 3.0000 mL | INTRAVENOUS | Status: DC | PRN

## 2024-10-12 MED ORDER — HEPARIN SODIUM (PORCINE) 1000 UNIT/ML IJ SOLN
INTRAMUSCULAR | Status: AC
  Filled 2024-10-12: qty 10

## 2024-10-12 MED ORDER — ASPIRIN 81 MG PO CHEW
81.0000 mg | CHEWABLE_TABLET | ORAL | Status: AC
  Administered 2024-10-12: 81 mg via ORAL

## 2024-10-12 MED ORDER — FREE WATER
500.0000 mL | Freq: Once | Status: AC
  Administered 2024-10-12: 500 mL via ORAL

## 2024-10-12 MED ORDER — MIDAZOLAM HCL (PF) 2 MG/2ML IJ SOLN
INTRAMUSCULAR | Status: DC | PRN
  Administered 2024-10-12: 1 mg via INTRAVENOUS

## 2024-10-12 MED ORDER — CLOPIDOGREL BISULFATE 75 MG PO TABS
ORAL_TABLET | ORAL | Status: DC | PRN
  Administered 2024-10-12: 600 mg via ORAL

## 2024-10-12 MED ORDER — FENTANYL CITRATE (PF) 100 MCG/2ML IJ SOLN
INTRAMUSCULAR | Status: AC
  Filled 2024-10-12: qty 2

## 2024-10-12 MED ORDER — HYDRALAZINE HCL 20 MG/ML IJ SOLN: 10.0000 mg | INTRAMUSCULAR | Status: AC | PRN

## 2024-10-12 MED ORDER — FREE WATER: 500.0000 mL | Freq: Once | Status: DC

## 2024-10-12 MED ORDER — LIDOCAINE HCL (PF) 1 % IJ SOLN
INTRAMUSCULAR | Status: DC | PRN
  Administered 2024-10-12: 5 mL

## 2024-10-12 MED ORDER — CLOPIDOGREL BISULFATE 75 MG PO TABS
ORAL_TABLET | ORAL | Status: AC
  Filled 2024-10-12: qty 8

## 2024-10-12 MED ORDER — VERAPAMIL HCL 2.5 MG/ML IV SOLN
INTRAVENOUS | Status: AC
  Filled 2024-10-12: qty 2

## 2024-10-12 MED ORDER — HEPARIN (PORCINE) IN NACL 1000-0.9 UT/500ML-% IV SOLN
INTRAVENOUS | Status: DC | PRN
  Administered 2024-10-12 (×2): 500 mL

## 2024-10-12 MED ORDER — CLOPIDOGREL BISULFATE 75 MG PO TABS: 75.0000 mg | ORAL_TABLET | Freq: Every day | ORAL | 0 refills | Status: AC

## 2024-10-12 MED ORDER — LIDOCAINE HCL 1 % IJ SOLN
INTRAMUSCULAR | Status: AC
  Filled 2024-10-12: qty 20
